# Patient Record
Sex: Male | Born: 1970 | Race: Black or African American | Hispanic: No | Marital: Single | Smoking: Light tobacco smoker
Health system: Southern US, Community
[De-identification: ages and names within clinical notes are randomized; demographics above are authoritative.]

## PROBLEM LIST (undated history)

## (undated) DIAGNOSIS — F329 Major depressive disorder, single episode, unspecified: Secondary | ICD-10-CM

## (undated) DIAGNOSIS — F32A Depression, unspecified: Secondary | ICD-10-CM

## (undated) DIAGNOSIS — M1990 Unspecified osteoarthritis, unspecified site: Secondary | ICD-10-CM

## (undated) DIAGNOSIS — E785 Hyperlipidemia, unspecified: Secondary | ICD-10-CM

## (undated) DIAGNOSIS — C801 Malignant (primary) neoplasm, unspecified: Secondary | ICD-10-CM

## (undated) DIAGNOSIS — N2889 Other specified disorders of kidney and ureter: Secondary | ICD-10-CM

## (undated) DIAGNOSIS — K219 Gastro-esophageal reflux disease without esophagitis: Secondary | ICD-10-CM

## (undated) DIAGNOSIS — I10 Essential (primary) hypertension: Secondary | ICD-10-CM

## (undated) HISTORY — DX: Unspecified osteoarthritis, unspecified site: M19.90

## (undated) HISTORY — DX: Depression, unspecified: F32.A

## (undated) HISTORY — DX: Hyperlipidemia, unspecified: E78.5

## (undated) HISTORY — DX: Other specified disorders of kidney and ureter: N28.89

## (undated) HISTORY — DX: Essential (primary) hypertension: I10

## (undated) HISTORY — PX: KNEE ARTHROSCOPY: SUR90

## (undated) HISTORY — DX: Gastro-esophageal reflux disease without esophagitis: K21.9

## (undated) HISTORY — DX: Malignant (primary) neoplasm, unspecified: C80.1

## (undated) HISTORY — DX: Major depressive disorder, single episode, unspecified: F32.9

---

## 2012-07-29 ENCOUNTER — Ambulatory Visit: Payer: Self-pay

## 2012-09-03 DIAGNOSIS — L663 Perifolliculitis capitis abscedens: Secondary | ICD-10-CM | POA: Insufficient documentation

## 2014-09-28 DIAGNOSIS — M170 Bilateral primary osteoarthritis of knee: Secondary | ICD-10-CM | POA: Insufficient documentation

## 2014-10-13 DIAGNOSIS — I10 Essential (primary) hypertension: Secondary | ICD-10-CM | POA: Insufficient documentation

## 2014-10-13 DIAGNOSIS — R0609 Other forms of dyspnea: Secondary | ICD-10-CM | POA: Insufficient documentation

## 2016-02-12 DIAGNOSIS — G8929 Other chronic pain: Secondary | ICD-10-CM | POA: Insufficient documentation

## 2016-02-12 DIAGNOSIS — M25562 Pain in left knee: Secondary | ICD-10-CM

## 2016-02-12 DIAGNOSIS — M25561 Pain in right knee: Secondary | ICD-10-CM

## 2016-12-10 ENCOUNTER — Encounter: Payer: Self-pay | Admitting: Student in an Organized Health Care Education/Training Program

## 2016-12-10 ENCOUNTER — Ambulatory Visit
Payer: Medicaid Other | Attending: Student in an Organized Health Care Education/Training Program | Admitting: Student in an Organized Health Care Education/Training Program

## 2016-12-10 VITALS — BP 128/89 | HR 60 | Temp 98.1°F | Resp 16 | Ht 72.0 in | Wt 348.0 lb

## 2016-12-10 DIAGNOSIS — M25561 Pain in right knee: Secondary | ICD-10-CM | POA: Diagnosis not present

## 2016-12-10 DIAGNOSIS — Z9889 Other specified postprocedural states: Secondary | ICD-10-CM | POA: Diagnosis not present

## 2016-12-10 DIAGNOSIS — M170 Bilateral primary osteoarthritis of knee: Secondary | ICD-10-CM | POA: Insufficient documentation

## 2016-12-10 DIAGNOSIS — G8929 Other chronic pain: Secondary | ICD-10-CM

## 2016-12-10 DIAGNOSIS — M25562 Pain in left knee: Secondary | ICD-10-CM | POA: Insufficient documentation

## 2016-12-10 DIAGNOSIS — Z87442 Personal history of urinary calculi: Secondary | ICD-10-CM | POA: Insufficient documentation

## 2016-12-10 DIAGNOSIS — G894 Chronic pain syndrome: Secondary | ICD-10-CM

## 2016-12-10 DIAGNOSIS — M1711 Unilateral primary osteoarthritis, right knee: Secondary | ICD-10-CM

## 2016-12-10 DIAGNOSIS — I129 Hypertensive chronic kidney disease with stage 1 through stage 4 chronic kidney disease, or unspecified chronic kidney disease: Secondary | ICD-10-CM | POA: Diagnosis not present

## 2016-12-10 DIAGNOSIS — Z79899 Other long term (current) drug therapy: Secondary | ICD-10-CM | POA: Insufficient documentation

## 2016-12-10 DIAGNOSIS — M1712 Unilateral primary osteoarthritis, left knee: Secondary | ICD-10-CM

## 2016-12-10 DIAGNOSIS — N189 Chronic kidney disease, unspecified: Secondary | ICD-10-CM | POA: Insufficient documentation

## 2016-12-10 DIAGNOSIS — I10 Essential (primary) hypertension: Secondary | ICD-10-CM | POA: Diagnosis not present

## 2016-12-10 MED ORDER — GABAPENTIN 300 MG PO CAPS: ORAL_CAPSULE | ORAL | 1 refills | Status: DC

## 2016-12-10 NOTE — Progress Notes (Signed)
Safety precautions to be maintained throughout the outpatient stay will include: orient to surroundings, keep bed in low position, maintain call bell within reach at all times, provide assistance with transfer out of bed and ambulation.  

## 2016-12-10 NOTE — Progress Notes (Signed)
Patient's Name: Jeremy Conley  MRN: [id number]  Referring Provider: Leanor Kail, MD  DOB: Dec 10, 1970  PCP: [facility]  DOS: 12/10/2016  Note by: Gillis Santa, MD  Service setting: Ambulatory outpatient  Specialty: Interventional Pain Management  Location: [facility] (AMB) Pain Management Facility  Visit type: Initial Patient Evaluation  Patient type: New Patient   Primary Reason(s) for Visit: Encounter for initial evaluation of one or more chronic problems (new to examiner) potentially causing chronic pain, and posing a threat to normal musculoskeletal function. (Level of risk: High) CC: Knee Pain (bilateral scheduled for knee surgery to be followed by 2nd knee)  HPI  Jeremy Conley is a 46 y.o. year old, male patient, who comes today to see [place] for the first time for an initial evaluation of his chronic pain. He has Chronic pain syndrome; Morbid obesity ([place]); Bilateral primary osteoarthritis of knee; Essential hypertension; Dyspnea on effort; and H/O arthroscopic knee surgery on his problem list. Today he comes in for evaluation of his Knee Pain (bilateral scheduled for knee surgery to be followed by 2nd knee)  Pleasant male, history of morbid obesity status post 2 knee arthroscopies on the left- 1 and 1993 followed by an anterior cruciate ligament repair in 1997. Patient is a candidate for bilateral knee replacements which will tentatively be done in October or November of this year. Patient was referred by his primary care physician to optimize pain control prior to his knee replacement surgery. Plan is to start with left knee replacement in October tentatively followed by right knee replacement thereafter.  Pain Assessment: Location: Left, Right Knee Radiating: shoots down to the foot on the right, this occurred after having a genicular nerve block in May Onset: More than a month ago Duration: Chronic pain Quality: Throbbing, Constant Severity: 6 /10 (self-reported pain score)  Note:  Reported level is compatible with observation.                   Effect on ADL: slows him down.  altered his gait.  constantly feels as if he is going to fall Timing: Constant Modifying factors: medication, hot soaks.    Onset and Duration: Sudden and Gradual Cause of pain: Work related accident or event Severity: Getting worse, NAS-11 at its worse: 8/10, NAS-11 at its best: 6/10, NAS-11 now: 7/10 and NAS-11 on the average: 7/10 Timing: Morning, Afternoon and Evening Aggravating Factors: Bending, Climbing, Kneeling, Lifiting, Squatting, Stooping , Twisting, Walking, Walking uphill and Walking downhill Alleviating Factors: Medications and Resting Associated Problems: Numbness, Sweating, Tingling, Weakness, Pain that wakes patient up and Pain that does not allow patient to sleep Quality of Pain: Agonizing, Exhausting, Nagging, Sharp, Shooting, Stabbing, Tender, Throbbing, Tingling, Toothache-like and Uncomfortable Previous Examinations or Tests: Nerve block, X-rays and Orthoperdic evaluation Previous Treatments: Narcotic medications and The patient denies patient has had steroid injections and genicular nerve  block and denies benefit patient had a genicular nerve block done on the right on May 1 which she states was not helpful. Patient also has had intra-articular knee injections bilaterally in the past which were not helpful.  The patient comes into the clinics today for the first time for a chronic pain management evaluation.   Today I took the time to provide the patient with information regarding my pain practice. The patient was informed that my practice is divided into two sections: an interventional pain management section, as well as a completely separate and distinct medication management section. I explained that I have  procedure days for my interventional therapies, and evaluation days for follow-ups and medication management. Because of the amount of documentation required during both,  they are kept separated. This means that there is the possibility that he may be scheduled for a procedure on one day, and medication management the next. I have also informed him that because of staffing and facility limitations, I no longer take patients for medication management only. To illustrate the reasons for this, I gave the patient the example of surgeons, and how inappropriate it would be to refer a patient to his/her care, just to write for the post-surgical antibiotics on a surgery done by a different surgeon.   Because interventional pain management is my board-certified specialty, the patient was informed that joining my practice means that they are open to any and all interventional therapies. I made it clear that this does not mean that they will be forced to have any procedures done. What this means is that I believe interventional therapies to be essential part of the diagnosis and proper management of chronic pain conditions. Therefore, patients not interested in these interventional alternatives will be better served under the care of a different practitioner.  The patient was also made aware of my Comprehensive Pain Management Safety Guidelines where by joining my practice, they limit all of their nerve blocks and joint injections to those done by our practice, for as long as we are retained to manage their care.   Historic Controlled Substance Pharmacotherapy Review  PMP and historical list of controlled substances: Hydrocodone 5 mg 3 times a day when necessary, last prescription provided by [facility] Abbot, 11/21/2016 Most recent opioid analgesic: Hydrocodone 5 mg 3 times a day when necessary, last prescription provided by Hampton Abbot. Current opioid analgesics: None Highest recorded MME/day: 30 mg/day Medications: Patient brought medications to be checked, as requested Pharmacodynamics: Desired effects: Analgesia: The patient reports >50% benefit. Reported improvement in  function: The patient reports medication allows him to accomplish basic ADLs. Clinically meaningful improvement in function (CMIF): Sustained CMIF goals met Perceived effectiveness: Described as relatively effective, allowing for increase in activities of daily living (ADL) Undesirable effects: Side-effects or Adverse reactions: None reported Historical Monitoring: The patient  reports that he does not use drugs. List of all UDS Test(s): No results found for: MDMA, COCAINSCRNUR, PCPSCRNUR, PCPQUANT, CANNABQUANT, THCU, [place] List of all Serum Drug Screening Test(s):  No results found for: AMPHSCRSER, BARBSCRSER, BENZOSCRSER, COCAINSCRSER, PCPSCRSER, PCPQUANT, THCSCRSER, CANNABQUANT, OPIATESCRSER, OXYSCRSER, PROPOXSCRSER Historical Background Evaluation: [place] PDMP: Six (6) year initial data search conducted.             [place] Department of public safety, offender search: Editor, commissioning Information) Non-contributory Risk Assessment Profile: Aberrant behavior: None observed or detected today Risk factors for fatal opioid overdose: age 47-57 years old, male gender and sleep apnea Fatal overdose hazard ratio (HR): Calculation deferred Non-fatal overdose hazard ratio (HR): Calculation deferred Risk of opioid abuse or dependence: 0.7-3.0% with doses ? 36 MME/day and 6.1-26% with doses ? 120 MME/day. Substance use disorder (SUD) risk level: Pending results of Medical Psychology Evaluation for SUD Opioid risk tool (ORT) (Total Score): 0  ORT Scoring interpretation table:  Score <3 = Low Risk for SUD  Score between 4-7 = Moderate Risk for SUD  Score >8 = High Risk for Opioid Abuse   PHQ-2 Depression Scale:  Total score: 0  PHQ-2 Scoring interpretation table: (Score and probability of major depressive disorder)  Score 0 = No depression  Score 1 =  15.4% Probability  Score 2 = 21.1% Probability  Score 3 = 38.4% Probability  Score 4 = 45.5% Probability  Score 5 = 56.4% Probability  Score 6 = 78.6%  Probability   PHQ-9 Depression Scale:  Total score: 0  PHQ-9 Scoring interpretation table:  Score 0-4 = No depression  Score 5-9 = Mild depression  Score 10-14 = Moderate depression  Score 15-19 = Moderately severe depression  Score 20-27 = Severe depression (2.4 times higher risk of SUD and 2.89 times higher risk of overuse)   Pharmacologic Plan: Pending ordered tests and/or consults            Initial impression: Pending review of available data and ordered tests.  Meds   Current Meds  Medication Sig  . hydrochlorothiazide (HYDRODIURIL) 12.5 MG tablet Take 12.5 mg by mouth daily.  Marland Kitchen HYDROcodone-acetaminophen (NORCO/VICODIN) 5-325 MG tablet Take 1 tablet by mouth every 4 (four) hours as needed.  Marland Kitchen losartan (COZAAR) 25 MG tablet Take 1 tablet by mouth daily.  . meloxicam (MOBIC) 15 MG tablet Take 15 mg by mouth daily.    Imaging Review   Results for orders placed in visit on 07/29/12  DG Knee Complete 4 Views Right   Narrative PRIOR REPORT IMPORTED FROM AN EXTERNAL SYSTEM   PRIOR REPORT IMPORTED FROM THE [facility] WORKFLOW SYSTEM   REASON FOR EXAM:    chronic knee pain knots or cysts on head  COMMENTS:   PROCEDURE:     DXR - DXR KNEE RT COMP WITH OBLIQUES  - Jul 29 2012  1:53PM   RESULT:     Right knee images show multicompartmental degenerative  narrowing  with subchondral sclerosis and marginal hypertrophic spurring. There is no  definite fracture or dislocation. No foreign body is evident. There is  irregularity in the contour of the femoral heads.   IMPRESSION:  1. Severe degenerative change. No acute bony abnormality evident.   Dictation Site: 2      I definitely slow as a prescription prescribed what is presented Complexity Note: Imaging results reviewed. Results discussed using Layman's terms.               ROS  Cardiovascular History: High blood pressure Pulmonary or Respiratory History: No reported pulmonary signs or symptoms such as wheezing and difficulty  taking a deep full breath (Asthma), difficulty blowing air out (Emphysema), coughing up mucus (Bronchitis), persistent dry cough, or temporary stoppage of breathing during sleep Neurological History: No reported neurological signs or symptoms such as seizures, abnormal skin sensations, urinary and/or fecal incontinence, being born with an abnormal open spine and/or a tethered spinal cord Review of Past Neurological Studies: No results found for this or any previous visit. Psychological-Psychiatric History: No reported psychological or psychiatric signs or symptoms such as difficulty sleeping, anxiety, depression, delusions or hallucinations (schizophrenial), mood swings (bipolar disorders) or suicidal ideations or attempts Gastrointestinal History: No reported gastrointestinal signs or symptoms such as vomiting or evacuating blood, reflux, heartburn, alternating episodes of diarrhea and constipation, inflamed or scarred liver, or pancreas or irrregular and/or infrequent bowel movements Genitourinary History: No reported renal or genitourinary signs or symptoms such as difficulty voiding or producing urine, peeing blood, non-functioning kidney, kidney stones, difficulty emptying the bladder, difficulty controlling the flow of urine, or chronic kidney disease Hematological History: No reported hematological signs or symptoms such as prolonged bleeding, low or poor functioning platelets, bruising or bleeding easily, hereditary bleeding problems, low energy levels due to low hemoglobin or being anemic Endocrine  History: No reported endocrine signs or symptoms such as high or low blood sugar, rapid heart rate due to high thyroid levels, obesity or weight gain due to slow thyroid or thyroid disease Rheumatologic History: Joint aches and or swelling due to excess weight (Osteoarthritis) Musculoskeletal History: Negative for myasthenia gravis, muscular dystrophy, multiple sclerosis or malignant hyperthermia Work  History: Out of work due to pain  Allergies  Mr. Breault has No Known Allergies.  Laboratory Chemistry  Inflammation Markers (CRP: Acute Phase) (ESR: Chronic Phase) No results found for: CRP, ESRSEDRATE               Renal Function Markers No results found for: BUN, CREATININE, GFRAA, GFRNONAA               Hepatic Function Markers No results found for: AST, ALT, ALBUMIN, ALKPHOS, HCVAB               Electrolytes No results found for: NA, K, CL, CALCIUM, MG               Neuropathy Markers No results found for: [id number]               Bone Pathology Markers No results found for: Hendricks Milo, VD125OH2TOT, [id number], [id number], 25OHVITD1, 25OHVITD2, 25OHVITD3, CALCIUM, TESTOFREE, TESTOSTERONE               Coagulation Parameters No results found for: INR, LABPROT, APTT, PLT               Cardiovascular Markers No results found for: BNP, HGB, HCT               Note: Lab results reviewed.  PFSH  Drug: Mr. Sampedro  reports that he does not use drugs. Alcohol:  reports that he drinks alcohol. Tobacco:  reports that he has never smoked. He has never used smokeless tobacco. Medical:  has a past medical history of Arthritis and Hypertension. Family: family history includes Heart disease in his father.  Past Surgical History:  Procedure Laterality Date  . KNEE ARTHROSCOPY Left 1993 and 1997   Active Ambulatory Problems    Diagnosis Date Noted  . Chronic pain syndrome 12/10/2016  . Morbid obesity ([place]) 12/10/2016  . Bilateral primary osteoarthritis of knee 09/28/2014  . Essential hypertension 10/13/2014  . Dyspnea on effort 10/13/2014  . H/O arthroscopic knee surgery 12/10/2016   Resolved Ambulatory Problems    Diagnosis Date Noted  . No Resolved Ambulatory Problems   Past Medical History:  Diagnosis Date  . Arthritis   . Hypertension    Constitutional Exam  General appearance: alert, oriented, in no distress and morbidly obese Vitals:   12/10/16 1122  BP:  128/89  Pulse: 60  Resp: 16  Temp: 98.1 F (36.7 C)  TempSrc: Oral  SpO2: 99%  Weight: (!) 348 lb (157.9 kg)  Height: 6' (1.829 m)   BMI Assessment: Estimated body mass index is 47.2 kg/m as calculated from the following:   Height as of this encounter: 6' (1.829 m).   Weight as of this encounter: 348 lb (157.9 kg).  BMI interpretation table: BMI level Category Range association with higher incidence of chronic pain  <18 kg/m2 Underweight   18.5-24.9 kg/m2 Ideal body weight   25-29.9 kg/m2 Overweight Increased incidence by 20%  30-34.9 kg/m2 Obese (Class I) Increased incidence by 68%  35-39.9 kg/m2 Severe obesity (Class II) Increased incidence by 136%  >40 kg/m2 Extreme obesity (Class III) Increased incidence by 254%  BMI Readings from Last 4 Encounters:  12/10/16 47.20 kg/m   Wt Readings from Last 4 Encounters:  12/10/16 (!) 348 lb (157.9 kg)  Psych/Mental status: Alert, oriented x 3 (person, place, & time)       Eyes: PERLA Respiratory: No evidence of acute respiratory distress  Cervical Spine Exam  Inspection: No masses, redness, or swelling Alignment: Symmetrical Functional ROM: Unrestricted ROM      Stability: No instability detected Muscle strength & Tone: Functionally intact Sensory: Unimpaired Palpation: No palpable anomalies              Upper Extremity (UE) Exam    Side: Right upper extremity  Side: Left upper extremity  Inspection: No masses, redness, swelling, or asymmetry. No contractures  Inspection: No masses, redness, swelling, or asymmetry. No contractures  Functional ROM: Unrestricted ROM          Functional ROM: Unrestricted ROM          Muscle strength & Tone: Functionally intact  Muscle strength & Tone: Functionally intact  Sensory: Unimpaired  Sensory: Unimpaired  Palpation: No palpable anomalies              Palpation: No palpable anomalies              Specialized Test(s): Deferred         Specialized Test(s): Deferred          Thoracic  Spine Exam  Inspection: No masses, redness, or swelling Alignment: Symmetrical Functional ROM: Decreased ROM Stability: No instability detected Sensory: Unimpaired Muscle strength & Tone: No palpable anomalies  Lumbar Spine Exam  Inspection: No masses, redness, or swelling Alignment: Symmetrical Functional ROM: Decreased ROM      Stability: Possibly unstable Muscle strength & Tone: Functionally intact Sensory: Unimpaired Palpation: No palpable anomalies       Provocative Tests: Lumbar Hyperextension and rotation test: Positive       Lumbar Lateral bending test: Positive       Patrick's Maneuver: evaluation deferred today                    Gait & Posture Assessment  Ambulation: Patient ambulates using a cane Gait: Limited. Using assistive device to ambulate Posture: WNL   Lower Extremity Exam    Side: Right lower extremity  Side: Left lower extremity  Inspection: No masses, redness, swelling, or asymmetry. No contractures  Inspection: No masses, redness, swelling, or asymmetry. No contractures  Functional ROM: Decreased ROM          Functional ROM: Decreased ROM          Muscle strength & Tone: Normal strength (5/5)  Muscle strength & Tone: Movement possible against some resistance (4/5)  Sensory: Movement-associated pain  Sensory: Movement-associated discomfort  Palpation: Complains of area being tender to palpation- knee area  Palpation: Uncomfortable   Assessment  Primary Diagnosis & Pertinent Problem List: The primary encounter diagnosis was Chronic pain syndrome. Diagnoses of Primary osteoarthritis of right knee, Primary osteoarthritis of left knee, Morbid obesity ([place]), Essential hypertension, Chronic pain of both knees, and H/O arthroscopic knee surgery were also pertinent to this visit.  Visit Diagnosis (New problems to examiner): 1. Chronic pain syndrome   2. Primary osteoarthritis of right knee   3. Primary osteoarthritis of left knee   4. Morbid obesity ([place])    5. Essential hypertension   6. Chronic pain of both knees   7. H/O arthroscopic knee surgery    Plan of Care (Initial  workup plan)  Note: Please be advised that as per protocol, today's visit has been an evaluation only. We have not taken over the patient's controlled substance management. Patient DOES NOT have opioid contract with [facility] yet.  Problem-specific plan:  Mr. Cothern is a very pleasant 46 year old gentleman with past medical history of morbid obesity, hypertension, primary osteoarthritis of bilateral knees. Patient has had left knee arthroscopies done in 1993 followed by an anterior cruciate ligament repair in 1997. He is deemed to be a candidate for bilateral knee replacement surgery with goals of starting with the left knee replacement first in October followed by right knee replacement thereafter. In terms of current medications the patient takes Mobic 15 mg daily and in the past was prescribed hydrocodone 5 mg as needed which she was taking up to 3 times a day. This was prescribed by his primary care provider for a short period of time.  We discussed starting gabapentin which should help out with his generalized knee pain. I told the patient to start at a dose of 300 mg daily at bedtime for 1 week, increase to 300 mg twice a day thereafter, if no side effects to increase to 300 mg 3 times a day on week 3. I also emphasized to the patient that minimizing his opioid consumption prior to his surgery will allow better pain control with opioid therapy for his postoperative pain. However I do agree that the patient's pain preoperatively should be better controlled. I will try to add and titrate non-opioid analgesics for this patient. If this is not effective we can consider very low dose opioid therapy such as hydrocodone 5 mg twice a day to 3 times a day when necessary. Patient has had a right genicular nerve block done on May 1 which she did not find effective. Patient has also had  intra-articular steroid injections that were performed in the past which were not helpful and in fact caused him to have elevated blood pressures.   Today we did the following:  #1. Urine toxicology screen. Expect this to be negative. #2. Start gabapentin as below. Goal is to get to 300 mg 3 times a day. Titration of structures given. Continue with Mobic 69m daily as prescribed. #3. Med/psych evaluation for chronic opioid management risk assessment. #4. Patient counseled on healthy eating, exercise, and weight loss #5. Patient will follow-up in 4-6 weeks and at that time we can discuss starting low-dose opioid therapy if gabapentin is not helpful up and assuming patient has had med psych evaluation done with no issues and urine toxicology screen was negative.   Ordered Lab-work, Procedure(s), Referral(s), & Consult(s): Orders Placed This Encounter  Procedures  . Compliance Drug Analysis, Ur  . Ambulatory referral to Psychology   Pharmacotherapy (current): Medications ordered:  Meds ordered this encounter  Medications  . gabapentin (NEURONTIN) 300 MG capsule    Sig: 300 mg qhs x 1 week, then 300 mg BID x 1 week, then 300 mg TID    Dispense:  90 capsule    Refill:  1   Medications administered during this visit: Mr. SDouthithad no medications administered during this visit.   Pharmacological management options:  Opioid Analgesics: The patient was informed that there is no guarantee that he would be a candidate for opioid analgesics. The decision will be made following CDC guidelines. This decision will be based on the results of diagnostic studies, as well as Mr. Blankenbeckler's risk profile.   Membrane stabilizer: Gabapentin (has not  tried Lyrica or Topamax)  Muscle relaxant: To be determined at a later time  NSAID: continue Mobic as currently prescribed  Other analgesic(s): consider neuropathics, gels, TCAs, SNRs   Interventional management options: Mr. Messimer was informed that there is  no guarantee that he would be a candidate for interventional therapies. The decision will be based on the results of diagnostic studies, as well as Mr. Tuel's risk profile.  Procedure(s) under consideration:  -Repeat genicular nerve block -lumbar facet steroid injections   Provider-requested follow-up: Return in about 4 weeks (around 01/07/2017).  No future appointments.  Primary Care Physician: [facility] Location: [facility] Note by: Gillis Santa, M.D, Date: 12/10/2016; Time: 12:34 PM  Patient Instructions   ____________________________________________________________________________________________  Appointment Policy Summary  It is our goal and responsibility to provide the medical community with assistance in the evaluation and management of patients with chronic pain. Unfortunately our resources are limited. Because we do not have an unlimited amount of time, or available appointments, we are required to closely monitor and manage their use. The following rules exist to maximize their use:  Patient's responsibilities: 1. Punctuality:  At what time should I arrive? You should be physically present in our office 30 minutes before your scheduled appointment. Your scheduled appointment is with your assigned healthcare provider. However, it takes 5-10 minutes to be "checked-in", and another 15 minutes for the nurses to do the admission. If you arrive to our office at the time you were given for your appointment, you will end up being at least 20-25 minutes late to your appointment with the provider. 2. Tardiness:  What happens if I arrive only a few minutes after my scheduled appointment time? You will need to reschedule your appointment. The cutoff is your appointment time. This is why it is so important that you arrive at least 30 minutes before that appointment. If you have an appointment scheduled for 10:00 AM and you arrive at 10:01, you will be  required to reschedule your appointment.  3. Plan ahead:  Always assume that you will encounter traffic on your way in. Plan for it. If you are dependent on a driver, make sure they understand these rules and the need to arrive early. 4. Other appointments and responsibilities:  Avoid scheduling any other appointments before or after your pain clinic appointments.  5. Be prepared:  Write down everything that you need to discuss with your healthcare provider and give this information to the admitting nurse. Write down the medications that you will need refilled. Bring your pills and bottles (even the empty ones), to all of your appointments, except for those where a procedure is scheduled. 6. No children or pets:  Find someone to take care of them. It is not appropriate to bring them in. 7. Scheduling changes:  We request "advanced notification" of any changes or cancellations. 8. Advanced notification:  Defined as a time period of more than 24 hours prior to the originally scheduled appointment. This allows for the appointment to be offered to other patients. 9. Rescheduling:  When a visit is rescheduled, it will require the cancellation of the original appointment. For this reason they both fall within the category of "Cancellations".  10. Cancellations:  They require advanced notification. Any cancellation less than 24 hours before the  appointment will be recorded as a "No Show". 11. No Show:  Defined as an unkept appointment where the patient failed to notify or declare to the practice their intention or  inability to keep the appointment.  Corrective process for repeat offenders:  1. Tardiness: Three (3) episodes of rescheduling due to late arrivals will be recorded as one (1) "No Show". 2. Cancellation or reschedule: Three (3) cancellations or rescheduling will be recorded as one (1) "No Show". 3. "No Shows": Three (3) "No Shows" within a 12 month period will result in discharge from the  practice.  ____________________________________________________________________________________________ ____________________________________________________________________________________________  Pain Management Weight Control Diet   Note: Before starting this diet, make sure to talk to your primary care physician to make sure it is safe for you.   Breakfast:   1 boiled or pouched egg. You may use the egg white ready-made preparations.  1 wheat low calorie toast.   8 oz. black coffee with stevia (maximum of 2 packs). (No milk or creamer, and no other type of sweetener but stevia).   Lunch & Dinner:   5 oz. Lean Protein like chicken, fish, or lean meat (above 95% fat free).   No pork or any type of cold cuts or processed meats.   Steamed, backed or grilled, but not fried.   No oils, fats, or butter.   1 cup of steamed vegetables, or 1.5 cups if raw.   1 serving of salad, but no dressings, except vinegar or lemon juice.   Mid-day & Mid-afternoon snack:   1 fruit. No bananas. (total of 2 fruits per day)   Important Rules:  4. Must drink 100 oz. or more of water per day.   Consult your Primary Care Physician if you have a history of kidney failure, or congestive heart failure before doing this.  5. Take calcium and magnesium every day to avoid night cramps.   Consult your Primary Care Physician if you have a history of kidney failure, hypercalcemia, or parathyroid problems before doing this.   Over-the-counter calcium 600 to 1200 mg per day (in the morning). Take with Vitamin D 2000 IU every day.   Over-the-counter magnesium 400 to 500 mg per day (1-2 hours prior to bedtime).  6. Control salt intake. (You can use it but in moderation.)  7. Do not eat anything after 6:00pm.  8. Weight yourself every morning at the same time and record weight on a notebook.  9. Mix "Benefiber" 3 to 5 table spoons in water and drink before meals.  10. No sodas.  11. No alcohol.  12. No  sugar.  13. No artificial sweeteners.   Stevia without sugar is the only sweetener aloud.  14. No bread except for the one breakfast toast.   Low calorie wheat bread.  15. Duration of diet: 2 weeks at a time with 2 days' rest, then repeat.  16. Do not over-eat or over-indulge yourself in the 2 days of rest from the diet.  ____________________________________________________________________________________________

## 2016-12-10 NOTE — Patient Instructions (Addendum)
____________________________________________________________________________________________  Appointment Policy Summary  It is our goal and responsibility to provide the medical community with assistance in the evaluation and management of patients with chronic pain. Unfortunately our resources are limited. Because we do not have an unlimited amount of time, or available appointments, we are required to closely monitor and manage their use. The following rules exist to maximize their use:  Patient's responsibilities: 1. Punctuality:  At what time should I arrive? You should be physically present in our office 30 minutes before your scheduled appointment. Your scheduled appointment is with your assigned healthcare provider. However, it takes 5-10 minutes to be "checked-in", and another 15 minutes for the nurses to do the admission. If you arrive to our office at the time you were given for your appointment, you will end up being at least 20-25 minutes late to your appointment with the provider. 2. Tardiness:  What happens if I arrive only a few minutes after my scheduled appointment time? You will need to reschedule your appointment. The cutoff is your appointment time. This is why it is so important that you arrive at least 30 minutes before that appointment. If you have an appointment scheduled for 10:00 AM and you arrive at 10:01, you will be required to reschedule your appointment.  3. Plan ahead:  Always assume that you will encounter traffic on your way in. Plan for it. If you are dependent on a driver, make sure they understand these rules and the need to arrive early. 4. Other appointments and responsibilities:  Avoid scheduling any other appointments before or after your pain clinic appointments.  5. Be prepared:  Write down everything that you need to discuss with your healthcare provider and give this information to the admitting nurse. Write down the medications that you will need  refilled. Bring your pills and bottles (even the empty ones), to all of your appointments, except for those where a procedure is scheduled. 6. No children or pets:  Find someone to take care of them. It is not appropriate to bring them in. 7. Scheduling changes:  We request "advanced notification" of any changes or cancellations. 8. Advanced notification:  Defined as a time period of more than 24 hours prior to the originally scheduled appointment. This allows for the appointment to be offered to other patients. 9. Rescheduling:  When a visit is rescheduled, it will require the cancellation of the original appointment. For this reason they both fall within the category of "Cancellations".  10. Cancellations:  They require advanced notification. Any cancellation less than 24 hours before the  appointment will be recorded as a "No Show". 11. No Show:  Defined as an unkept appointment where the patient failed to notify or declare to the practice their intention or inability to keep the appointment.  Corrective process for repeat offenders:  1. Tardiness: Three (3) episodes of rescheduling due to late arrivals will be recorded as one (1) "No Show". 2. Cancellation or reschedule: Three (3) cancellations or rescheduling will be recorded as one (1) "No Show". 3. "No Shows": Three (3) "No Shows" within a 12 month period will result in discharge from the practice.  ____________________________________________________________________________________________ ____________________________________________________________________________________________  Pain Management Weight Control Diet   Note: Before starting this diet, make sure to talk to your primary care physician to make sure it is safe for you.   Breakfast:   1 boiled or pouched egg. You may use the egg white ready-made preparations.  1 wheat low calorie toast.   8  oz. black coffee with stevia (maximum of 2 packs). (No milk or creamer, and  no other type of sweetener but stevia).   Lunch & Dinner:   5 oz. Lean Protein like chicken, fish, or lean meat (above 95% fat free).   No pork or any type of cold cuts or processed meats.   Steamed, backed or grilled, but not fried.   No oils, fats, or butter.   1 cup of steamed vegetables, or 1.5 cups if raw.   1 serving of salad, but no dressings, except vinegar or lemon juice.   Mid-day & Mid-afternoon snack:   1 fruit. No bananas. (total of 2 fruits per day)   Important Rules:  4. Must drink 100 oz. or more of water per day.   Consult your Primary Care Physician if you have a history of kidney failure, or congestive heart failure before doing this.  5. Take calcium and magnesium every day to avoid night cramps.   Consult your Primary Care Physician if you have a history of kidney failure, hypercalcemia, or parathyroid problems before doing this.   Over-the-counter calcium 600 to 1200 mg per day (in the morning). Take with Vitamin D 2000 IU every day.   Over-the-counter magnesium 400 to 500 mg per day (1-2 hours prior to bedtime).  6. Control salt intake. (You can use it but in moderation.)  7. Do not eat anything after 6:00pm.  8. Weight yourself every morning at the same time and record weight on a notebook.  9. Mix "Benefiber" 3 to 5 table spoons in water and drink before meals.  10. No sodas.  11. No alcohol.  12. No sugar.  13. No artificial sweeteners.   Stevia without sugar is the only sweetener aloud.  14. No bread except for the one breakfast toast.   Low calorie wheat bread.  15. Duration of diet: 2 weeks at a time with 2 days' rest, then repeat.  16. Do not over-eat or over-indulge yourself in the 2 days of rest from the diet.  ____________________________________________________________________________________________

## 2016-12-16 LAB — COMPLIANCE DRUG ANALYSIS, UR

## 2017-04-08 ENCOUNTER — Other Ambulatory Visit: Payer: Self-pay

## 2017-04-08 ENCOUNTER — Encounter: Payer: Self-pay | Admitting: Emergency Medicine

## 2017-04-08 ENCOUNTER — Emergency Department
Admission: EM | Admit: 2017-04-08 | Discharge: 2017-04-08 | Disposition: A | Discharge status: Home / Self Care | Payer: Medicaid Other | Attending: Emergency Medicine | Admitting: Emergency Medicine

## 2017-04-08 DIAGNOSIS — I10 Essential (primary) hypertension: Secondary | ICD-10-CM

## 2017-04-08 DIAGNOSIS — M79602 Pain in left arm: Secondary | ICD-10-CM | POA: Insufficient documentation

## 2017-04-08 DIAGNOSIS — Z79899 Other long term (current) drug therapy: Secondary | ICD-10-CM | POA: Insufficient documentation

## 2017-04-08 DIAGNOSIS — R61 Generalized hyperhidrosis: Secondary | ICD-10-CM | POA: Diagnosis not present

## 2017-04-08 DIAGNOSIS — R0602 Shortness of breath: Secondary | ICD-10-CM

## 2017-04-08 DIAGNOSIS — R42 Dizziness and giddiness: Secondary | ICD-10-CM | POA: Diagnosis not present

## 2017-04-08 LAB — BASIC METABOLIC PANEL
ANION GAP: 10 (ref 5–15)
BUN: 17 mg/dL (ref 6–20)
CO2: 24 mmol/L (ref 22–32)
Calcium: 9.2 mg/dL (ref 8.9–10.3)
Chloride: 96 mmol/L — ABNORMAL LOW (ref 101–111)
Creatinine, Ser: 0.8 mg/dL (ref 0.61–1.24)
GFR calc non Af Amer: >60 mL/min (ref >60)
GLUCOSE: 129 mg/dL — AB (ref 65–99)
POTASSIUM: 3.5 mmol/L (ref 3.5–5.1)
Sodium: 130 mmol/L — ABNORMAL LOW (ref 135–145)

## 2017-04-08 LAB — URINALYSIS, COMPLETE (UACMP) WITH MICROSCOPIC
BACTERIA UA: NONE SEEN
BILIRUBIN URINE: NEGATIVE
Glucose, UA: NEGATIVE mg/dL
Hgb urine dipstick: NEGATIVE
KETONES UR: NEGATIVE mg/dL
Leukocytes, UA: NEGATIVE
Nitrite: NEGATIVE
PROTEIN: NEGATIVE mg/dL
Specific Gravity, Urine: 1.013 (ref 1.005–1.030)
pH: 5 (ref 5.0–8.0)

## 2017-04-08 LAB — CBC
HEMATOCRIT: 42.2 % (ref 40.0–52.0)
HEMOGLOBIN: 14.2 g/dL (ref 13.0–18.0)
MCH: 30.3 pg (ref 26.0–34.0)
MCHC: 33.6 g/dL (ref 32.0–36.0)
MCV: 90.2 fL (ref 80.0–100.0)
Platelets: 272 10*3/uL (ref 150–440)
RBC: 4.68 MIL/uL (ref 4.40–5.90)
RDW: 13.7 % (ref 11.5–14.5)
WBC: 10.3 10*3/uL (ref 3.8–10.6)

## 2017-04-08 LAB — TROPONIN I: Troponin I: <0.03 ng/mL (ref <0.03)

## 2017-04-08 MED ORDER — LOSARTAN POTASSIUM 50 MG PO TABS
50.0000 mg | ORAL_TABLET | Freq: Once | ORAL | Status: AC
  Administered 2017-04-08: 50 mg via ORAL
  Filled 2017-04-08: qty 1

## 2017-04-08 NOTE — ED Notes (Signed)
Pt to ed with c/o dizziness that has been intermittent x several weeks.  Today reports left arm pain and erratic "pulse", sob, and excessive sweating with minimal activity.

## 2017-04-08 NOTE — ED Triage Notes (Signed)
htn with recent adjustment to blood pressure medication, with increased feelings of dizziness and difficulty breathing , erratic heart racing. Today left arm pain

## 2017-04-08 NOTE — ED Provider Notes (Signed)
[facility] Emergency Department Provider Note  ____________________________________________   First MD Initiated Contact with Patient 04/08/17 1403     (approximate)  I have reviewed the triage vital signs and the nursing notes.   HISTORY  Chief Complaint Dizziness   HPI Jeremy Conley is a 46 y.o. male with a history of arthritis and hypertension was presented to the emergency department today with several months of lightheadedness, diaphoresis and shortness of breath with activity as well as intermittent left arm aching.  He says that his arm is aching right now and has been aching since he woke up this morning.  He is denying any other symptoms at this time.  Says that his blood pressure is also been about 160s over 90s over the past several weeks.  He says that he takes 50 mg of losartan as well as 25 mill grams of HCT daily and has not missed any doses.    Past Medical History:  Diagnosis Date  . Arthritis   . Hypertension     Patient Active Problem List   Diagnosis Date Noted  . Chronic pain syndrome 12/10/2016  . Morbid obesity ([place]) 12/10/2016  . H/O arthroscopic knee surgery 12/10/2016  . Essential hypertension 10/13/2014  . Dyspnea on effort 10/13/2014  . Bilateral primary osteoarthritis of knee 09/28/2014    Past Surgical History:  Procedure Laterality Date  . KNEE ARTHROSCOPY Left 1993 and 1997    Prior to Admission medications   Medication Sig Start Date End Date Taking? Authorizing Provider  gabapentin (NEURONTIN) 300 MG capsule 300 mg qhs x 1 week, then 300 mg BID x 1 week, then 300 mg TID 12/10/16   Gillis Santa, MD  hydrochlorothiazide (HYDRODIURIL) 12.5 MG tablet Take 12.5 mg by mouth daily. 08/12/16 08/12/17  [provider]  HYDROcodone-acetaminophen (NORCO/VICODIN) 5-325 MG tablet Take 1 tablet by mouth every 4 (four) hours as needed. 11/21/16   [provider]  losartan (COZAAR) 25 MG tablet Take 1 tablet  by mouth daily. 09/26/16   [provider]    Allergies Patient has no known allergies.  Family History  Problem Relation Age of Onset  . Heart disease Father     Social History Social History   Tobacco Use  . Smoking status: Never Smoker  . Smokeless tobacco: Never Used  Substance Use Topics  . Alcohol use: Yes    Comment: occassional  . Drug use: No    Review of Systems  Constitutional: No fever/chills Eyes: No visual changes. ENT: No sore throat. Cardiovascular: Denies chest pain. Respiratory: As above Gastrointestinal: No abdominal pain.  No nausea, no vomiting.  No diarrhea.  No constipation. Genitourinary: Negative for dysuria. Musculoskeletal: Negative for back pain. Skin: Negative for rash. Neurological: Negative for headaches, focal weakness or numbness.   ____________________________________________   PHYSICAL EXAM:  VITAL SIGNS: ED Triage Vitals  Enc Vitals Group     BP 04/08/17 1021 (!) 160/93     Pulse Rate 04/08/17 1021 76     Resp 04/08/17 1021 18     Temp 04/08/17 1021 98.5 F (36.9 C)     Temp Source 04/08/17 1021 Oral     SpO2 04/08/17 1021 96 %     Weight 04/08/17 1022 (!) 358 lb (162.4 kg)     Height 04/08/17 1022 6' (1.829 m)     Head Circumference --      Peak Flow --      Pain Score 04/08/17 1040 0  Pain Loc --      Pain Edu? --      Excl. in [place]? --     Constitutional: Alert and oriented. Well appearing and in no acute distress. Eyes: Conjunctivae are normal.  Head: Atraumatic. Nose: No congestion/rhinnorhea. Mouth/Throat: Mucous membranes are moist.  Neck: No stridor.   Cardiovascular: Normal rate, regular rhythm. Grossly normal heart sounds.  Good peripheral circulation with equal and bilateral radial pulses. Respiratory: Normal respiratory effort.  No retractions. Lungs CTAB. Gastrointestinal: Soft and nontender. No distention.  Musculoskeletal: No lower extremity tenderness nor edema.  No joint  effusions. Neurologic:  Normal speech and language. No gross focal neurologic deficits are appreciated. Skin:  Skin is warm, dry and intact. No rash noted. Psychiatric: Mood and affect are normal. Speech and behavior are normal.  ____________________________________________   LABS (all labs ordered are listed, but only abnormal results are displayed)  Labs Reviewed  BASIC METABOLIC PANEL - Abnormal; Notable for the following components:      Result Value   Sodium 130 (*)    Chloride 96 (*)    Glucose, Bld 129 (*)    All other components within normal limits  URINALYSIS, COMPLETE (UACMP) WITH MICROSCOPIC - Abnormal; Notable for the following components:   Color, Urine YELLOW (*)    APPearance HAZY (*)    Squamous Epithelial / LPF 0-5 (*)    All other components within normal limits  CBC  TROPONIN I  CBG MONITORING, ED   ____________________________________________  EKG  ED ECG REPORT I, Doran Stabler, the attending physician, personally viewed and interpreted this ECG.   Date: 04/08/2017  EKG Time: 1040  Rate: 67  Rhythm: normal sinus rhythm  Axis: Normal  Intervals:none  ST&T Change: No ST segment elevation or depression.  No abnormal T wave inversion.  ____________________________________________  RADIOLOGY   ____________________________________________   PROCEDURES  Procedure(s) performed:   Procedures  Critical Care performed:   ____________________________________________   INITIAL IMPRESSION / ASSESSMENT AND PLAN / ED COURSE  Pertinent labs & imaging results that were available during my care of the patient were reviewed by me and considered in my medical decision making (see chart for details).  DDX: Anginal equivalent, symptomatic hypertension, diaphoresis, shortness of breath  As part of my medical decision making, I reviewed the following data within the [place] chart  reviewed   ----------------------------------------- 3:10 PM on 04/08/2017 -----------------------------------------  Patient with symptoms ongoing for months with a normal EKG.  As long as the troponin is negative I believe the patient will be safe for outpatient follow-up.  I discussed the case with Dr. Humphrey Rolls of cardiology who says that the patient should follow-up in his office at 10 AM tomorrow morning and should double his losartan dose to 100 mg and keep the HCTZ dose consistent.   ----------------------------------------- 3:49 PM on 04/08/2017 -----------------------------------------  Patient at this time without any new complaints.  Very reassuring lab work.  Patient will be discharged home at this time.  Says that his father also had a heart attack and died in his late 39s.  Patient does have risk factors but with months of symptoms and a very reassuring cardiac workup today.  He states that he will be able to follow-up with Dr.Khan tomorrow.  Also, he is understanding of the plan to increase his losartan to 100 mg daily.  He knows to return to the emergency department for any worsening or concerning symptoms.  ____________________________________________   FINAL  CLINICAL IMPRESSION(S) / ED DIAGNOSES  Hypertension.  Shortness of breath.    NEW MEDICATIONS STARTED DURING THIS VISIT:  This SmartLink is deprecated. Use AVSMEDLIST instead to display the medication list for a patient.   Note:  This document was prepared using Dragon voice recognition software and may include unintentional dictation errors.     Orbie Pyo, MD 04/08/17 1550

## 2017-04-11 ENCOUNTER — Other Ambulatory Visit: Payer: Self-pay | Admitting: Registered Nurse

## 2017-07-15 ENCOUNTER — Other Ambulatory Visit: Payer: Self-pay | Admitting: Nurse Practitioner

## 2017-07-15 DIAGNOSIS — R413 Other amnesia: Secondary | ICD-10-CM

## 2017-07-17 ENCOUNTER — Other Ambulatory Visit (HOSPITAL_COMMUNITY): Payer: Self-pay | Admitting: Nurse Practitioner

## 2017-07-17 DIAGNOSIS — R413 Other amnesia: Secondary | ICD-10-CM

## 2017-07-21 ENCOUNTER — Ambulatory Visit (HOSPITAL_COMMUNITY): Payer: Medicaid Other

## 2017-07-30 ENCOUNTER — Ambulatory Visit (HOSPITAL_COMMUNITY): Payer: Medicaid Other

## 2017-08-05 DIAGNOSIS — N2889 Other specified disorders of kidney and ureter: Secondary | ICD-10-CM | POA: Insufficient documentation

## 2017-08-07 ENCOUNTER — Ambulatory Visit (HOSPITAL_COMMUNITY)
Admission: RE | Admit: 2017-08-07 | Discharge: 2017-08-07 | Disposition: A | Discharge status: Home / Self Care | Payer: Medicaid Other | Source: Ambulatory Visit | Attending: Nurse Practitioner | Admitting: Nurse Practitioner

## 2017-08-07 DIAGNOSIS — R413 Other amnesia: Secondary | ICD-10-CM | POA: Diagnosis not present

## 2017-08-07 DIAGNOSIS — L989 Disorder of the skin and subcutaneous tissue, unspecified: Secondary | ICD-10-CM | POA: Insufficient documentation

## 2017-08-07 LAB — POCT I-STAT CREATININE: CREATININE: 1 mg/dL (ref 0.61–1.24)

## 2017-08-07 MED ORDER — GADOBENATE DIMEGLUMINE 529 MG/ML IV SOLN
20.0000 mL | Freq: Once | INTRAVENOUS | Status: AC | PRN
  Administered 2017-08-07: 20 mL via INTRAVENOUS

## 2017-08-11 ENCOUNTER — Ambulatory Visit (INDEPENDENT_AMBULATORY_CARE_PROVIDER_SITE_OTHER): Payer: Medicaid Other | Admitting: Urology

## 2017-08-11 ENCOUNTER — Encounter: Payer: Self-pay | Admitting: Urology

## 2017-08-11 VITALS — BP 132/85 | HR 76 | Ht 72.0 in | Wt 363.9 lb

## 2017-08-11 DIAGNOSIS — N289 Disorder of kidney and ureter, unspecified: Secondary | ICD-10-CM

## 2017-08-11 DIAGNOSIS — N281 Cyst of kidney, acquired: Secondary | ICD-10-CM

## 2017-08-11 NOTE — Progress Notes (Signed)
08/11/2017 9:53 AM   Jeremy Conley 1970-11-03 [id number]  Referring provider: [facility] [place], [place] [address]  Chief Complaint  Patient presents with  . New Patient (Initial Visit)    HPI: The patient was referred for a possible left renal mass.  He may have a stomach issue or hernia.  He has nighttime frequency.  His flow was good.  He does never had kidney stones or previous GU surgery.  He does have acid reflux disease  The patient had a CT scan across the street but it was in the [facility] system.  He has 2 nonenhancing cysts the largest being 1.6 cm in the left kidney.  In the inferior pole of the left kidney there was a less than 1 cm lesion that enhanced.  They were concerned that it may be a renal cell carcinoma.  Modifying factors: There are no other modifying factors  Associated signs and symptoms: There are no other associated signs and symptoms Aggravating and relieving factors: There are no other aggravating or relieving factors Severity: Moderate Duration: Persistent   PMH: Past Medical History:  Diagnosis Date  . Arthritis   . Arthritis   . Depression   . GERD (gastroesophageal reflux disease)   . Hyperlipidemia   . Hypertension     Surgical History: Past Surgical History:  Procedure Laterality Date  . KNEE ARTHROSCOPY Left 1993 and 1997    Home Medications:  Allergies as of 08/11/2017   No Known Allergies     Medication List        Accurate as of 08/11/17 11:59 PM. Always use your most recent med list.          aspirin EC 81 MG tablet Take 81 mg by mouth daily.   atorvastatin 20 MG tablet Commonly known as:  LIPITOR Take 20 mg by mouth.   clindamycin 1 % external solution Commonly known as:  CLEOCIN T Apply topically to scalp and face daily.   hydrochlorothiazide 25 MG tablet Commonly known as:  HYDRODIURIL Take 25 mg by mouth.   losartan 50 MG tablet Commonly known as:  COZAAR Take  100 mg by mouth once.   meloxicam 7.5 MG tablet Commonly known as:  MOBIC Take 7.5 mg by mouth.   triamcinolone cream 0.1 % Commonly known as:  KENALOG Apply to rash on arm 1-2 times daily as needed.       Allergies: No Known Allergies  Family History: Family History  Problem Relation Age of Onset  . Heart disease Father   . Bladder Cancer Neg Hx   . Kidney cancer Neg Hx   . Prostate cancer Neg Hx     Social History:  reports that he has never smoked. He has never used smokeless tobacco. He reports that he drinks alcohol. He reports that he does not use drugs.  ROS: UROLOGY Frequent Urination?: Yes Hard to postpone urination?: No Burning/pain with urination?: No Get up at night to urinate?: No Leakage of urine?: No Urine stream starts and stops?: No Trouble starting stream?: No Do you have to strain to urinate?: No Blood in urine?: No Urinary tract infection?: No Sexually transmitted disease?: No Injury to kidneys or bladder?: No Painful intercourse?: No Weak stream?: No Erection problems?: Yes Penile pain?: No  Gastrointestinal Nausea?: No Vomiting?: No Indigestion/heartburn?: No Diarrhea?: Yes Constipation?: Yes  Constitutional Fever: No Night sweats?: No Weight loss?: No Fatigue?: No  Skin Skin rash/lesions?: No Itching?: No  Eyes Blurred vision?: No Double vision?: No  Ears/Nose/Throat Sore throat?: No Sinus problems?: No  Hematologic/Lymphatic Swollen glands?: No Easy bruising?: No  Cardiovascular Leg swelling?: No Chest pain?: No  Respiratory Cough?: No Shortness of breath?: Yes  Endocrine Excessive thirst?: No  Musculoskeletal Back pain?: No Joint pain?: Yes  Neurological Headaches?: No Dizziness?: No  Psychologic Depression?: Yes Anxiety?: Yes  Physical Exam: BP 132/85 (BP Location: Right Arm, Patient Position: Sitting, Cuff Size: Large)   Pulse 76   Ht 6' (1.829 m)   Wt (!) 363 lb 14.4 oz (165.1 kg)   BMI  49.35 kg/m   Constitutional:  Alert and oriented, No acute distress. HEENT: [place] AT, moist mucus membranes.  Trachea midline, no masses. Cardiovascular: No clubbing, cyanosis, or edema. Respiratory: Normal respiratory effort, no increased work of breathing. GI: Abdomen is soft, nontender, nondistended, no abdominal masses GU: High riding scrotum within normal limits; 40 g benign prostate Skin: No rashes, bruises or suspicious lesions. Lymph: No cervical or inguinal adenopathy. Neurologic: Grossly intact, no focal deficits, moving all 4 extremities. Psychiatric: Normal mood and affect.  Laboratory Data: Lab Results  Component Value Date   WBC 10.3 04/08/2017   HGB 14.2 04/08/2017   HCT 42.2 04/08/2017   MCV 90.2 04/08/2017   PLT 272 04/08/2017    Lab Results  Component Value Date   CREATININE 1.00 08/07/2017    No results found for: PSA  No results found for: TESTOSTERONE  No results found for: HGBA1C  Urinalysis    Component Value Date/Time   COLORURINE YELLOW (A) 04/08/2017 1038   APPEARANCEUR HAZY (A) 04/08/2017 1038   LABSPEC 1.013 04/08/2017 1038   PHURINE 5.0 04/08/2017 1038   GLUCOSEU NEGATIVE 04/08/2017 1038   HGBUR NEGATIVE 04/08/2017 [place] 04/08/2017 [place] 04/08/2017 1038   PROTEINUR NEGATIVE 04/08/2017 1038   NITRITE NEGATIVE 04/08/2017 [place] 04/08/2017 1038    Pertinent Imaging: See above  Assessment & Plan: I felt that it was best to order a renal MRI to assess the current mass and not in 6 months as noted. F/up with Dr Erlene Quan  There are no diagnoses linked to this encounter.  No follow-ups on file.  Reece Packer, MD  [facility] [address], [place], [place] [address] [phone]

## 2017-08-21 ENCOUNTER — Ambulatory Visit (HOSPITAL_COMMUNITY)
Admission: RE | Admit: 2017-08-21 | Discharge: 2017-08-21 | Disposition: A | Discharge status: Home / Self Care | Payer: Medicaid Other | Source: Ambulatory Visit | Attending: Urology | Admitting: Urology

## 2017-08-21 DIAGNOSIS — N281 Cyst of kidney, acquired: Secondary | ICD-10-CM | POA: Diagnosis present

## 2017-08-21 DIAGNOSIS — N2889 Other specified disorders of kidney and ureter: Secondary | ICD-10-CM | POA: Insufficient documentation

## 2017-08-21 DIAGNOSIS — K760 Fatty (change of) liver, not elsewhere classified: Secondary | ICD-10-CM | POA: Diagnosis not present

## 2017-08-21 MED ORDER — GADOBENATE DIMEGLUMINE 529 MG/ML IV SOLN
20.0000 mL | Freq: Once | INTRAVENOUS | Status: AC | PRN
Start: 2017-08-21 — End: 2017-08-21
  Administered 2017-08-21: 20 mL via INTRAVENOUS

## 2017-08-26 ENCOUNTER — Ambulatory Visit (INDEPENDENT_AMBULATORY_CARE_PROVIDER_SITE_OTHER): Payer: Medicaid Other | Admitting: Urology

## 2017-08-26 VITALS — BP 139/89 | HR 75 | Ht 72.0 in | Wt 373.0 lb

## 2017-08-26 DIAGNOSIS — R222 Localized swelling, mass and lump, trunk: Secondary | ICD-10-CM

## 2017-08-26 DIAGNOSIS — N2889 Other specified disorders of kidney and ureter: Secondary | ICD-10-CM | POA: Diagnosis not present

## 2017-08-26 NOTE — Progress Notes (Signed)
08/26/2017 2:49 PM   Jeremy Conley 25-Nov-1970 [id number]  Referring provider: [facility], [facility] [place], [place] [address]  Chief Complaint  Patient presents with  . Follow-up    MRI results    HPI: 47 year old morbidly obese male presenting for follow-up of renal MRI.  He was initially referred for further evaluation of an indeterminate renal mass seen on CT scan at [facility].  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  He also has 2 other Bosniak 2 renal cyst on the left which are non-worrisome.    He denies any flank pain or gross hematuria.  No family history of kidney cancer.  His primary complaint today is bulging of his abdominal wall in the midline just above his umbilicus.  He did have a CT of his abdomen at [facility] (unable to view images but no report of abdominal wall hernia).  He is extremely bothered by this and reports that it affects his sex life.  It is also uncomfortable and painful at times.  No associated nausea or vomiting.   PMH: Past Medical History:  Diagnosis Date  . Arthritis   . Arthritis   . Depression   . GERD (gastroesophageal reflux disease)   . Hyperlipidemia   . Hypertension     Surgical History: Past Surgical History:  Procedure Laterality Date  . KNEE ARTHROSCOPY Left 1993 and 1997    Home Medications:  Allergies as of 08/26/2017   No Known Allergies     Medication List        Accurate as of 08/26/17  2:49 PM. Always use your most recent med list.          aspirin EC 81 MG tablet Take 81 mg by mouth daily.   atorvastatin 20 MG tablet Commonly known as:  LIPITOR Take 20 mg by mouth.   clindamycin 1 % external solution Commonly known as:  CLEOCIN T Apply topically to scalp and face daily.   hydrochlorothiazide 25 MG tablet Commonly known as:  HYDRODIURIL Take 25 mg by  mouth.   losartan 50 MG tablet Commonly known as:  COZAAR Take 100 mg by mouth once.   meloxicam 7.5 MG tablet Commonly known as:  MOBIC Take 7.5 mg by mouth.   triamcinolone cream 0.1 % Commonly known as:  KENALOG Apply to rash on arm 1-2 times daily as needed.       Allergies: No Known Allergies  Family History: Family History  Problem Relation Age of Onset  . Heart disease Father   . Bladder Cancer Neg Hx   . Kidney cancer Neg Hx   . Prostate cancer Neg Hx     Social History:  reports that he has never smoked. He has never used smokeless tobacco. He reports that he drinks alcohol. He reports that he does not use drugs.  ROS: UROLOGY Frequent Urination?: No Hard to postpone urination?: No Burning/pain with urination?: No Get up at night to urinate?: No Leakage of urine?: No Urine stream starts and stops?: No Trouble starting stream?: No Do you have to strain to urinate?: No Blood in urine?: No Urinary tract infection?: No Sexually transmitted disease?: No Injury to kidneys or bladder?: No Painful intercourse?: No Weak stream?: No Erection problems?: Yes Penile pain?: No  Gastrointestinal Nausea?: No Vomiting?: Yes Indigestion/heartburn?: Yes Diarrhea?: Yes Constipation?:  Yes  Constitutional Fever: No Night sweats?: Yes Weight loss?: No Fatigue?: Yes  Skin Skin rash/lesions?: Yes Itching?: No  Eyes Blurred vision?: No Double vision?: No  Ears/Nose/Throat Sore throat?: No Sinus problems?: No  Hematologic/Lymphatic Swollen glands?: No Easy bruising?: No  Cardiovascular Leg swelling?: No Chest pain?: No  Respiratory Cough?: No Shortness of breath?: Yes  Endocrine Excessive thirst?: No  Musculoskeletal Back pain?: No Joint pain?: Yes  Neurological Headaches?: No Dizziness?: No  Psychologic Depression?: No Anxiety?: No  Physical Exam: BP 139/89 (BP Location: Left Arm, Patient Position: Sitting, Cuff Size: Large)    Pulse 75   Ht 6' (1.829 m)   Wt (!) 373 lb (169.2 kg)   BMI 50.59 kg/m   Constitutional:  Alert and oriented, No acute distress. HEENT: [place] AT, moist mucus membranes.  Trachea midline, no masses. Cardiovascular: No clubbing, cyanosis, or edema. Respiratory: Normal respiratory effort, no increased work of breathing. GI: Abdomen morbidly obese with tenderness just above his umbilicus.  With Valsalva and leaning forward, there is protuberance in this area with a palpable mass concerning for either rectus diastases versus an abdominal wall hernia. GU: No CVA tenderness Skin: No rashes, bruises or suspicious lesions. Neurologic: Grossly intact, no focal deficits, moving all 4 extremities. Psychiatric: Normal mood and affect.  Laboratory Data: Lab Results  Component Value Date   WBC 10.3 04/08/2017   HGB 14.2 04/08/2017   HCT 42.2 04/08/2017   MCV 90.2 04/08/2017   PLT 272 04/08/2017    Lab Results  Component Value Date   CREATININE 1.00 08/07/2017    Urinalysis N/a  Pertinent Imaging: CLINICAL DATA:  Cystic lesions of the left kidney including a potential enhancing lesion. For further workup.  EXAM: MRI ABDOMEN WITHOUT AND WITH CONTRAST  TECHNIQUE: Multiplanar multisequence MR imaging of the abdomen was performed both before and after the administration of intravenous contrast.  CONTRAST:  16mL MULTIHANCE GADOBENATE DIMEGLUMINE 529 MG/ML IV SOLN  COMPARISON:  We are happy to provide direct comparison to the exam which prompted today's study if it can be provided.  FINDINGS: Body habitus reduces diagnostic sensitivity and specificity.  Lower chest: Unremarkable  Hepatobiliary: Diffuse hepatic steatosis.  Unremarkable  Pancreas:  Unremarkable  Spleen:  Unremarkable  Adrenals/Urinary Tract:  Adrenal glands normal.  The presume lesion of concern along the left kidney lower pole posteriorly has low precontrast T2 signal characteristics and measures 1.0  by 0.9 cm on image 39/6. 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  1.6 by 1.3 cm left mid kidney lesion with high T2 and high T1 signal characteristics and no internal enhancement, compatible with a Bosniak category 2 cyst, shown on image 31/6.  1.0 by 0.8 cm lesion with high T2 and intermediate T1 signal characteristics as shown on image 35/6 demonstrates no internal enhancement, compatible with a Bosniak category 2 cyst.  Approximately 10 mm lesion with low T1 and high T2 signal characteristics in the upper pole the right kidney on image 24/7 demonstrates no significant appreciable enhancement, favoring Bosniak category 1 cyst.  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 I do not see a discrete mass like appearance.  Musculoskeletal: Unremarkable  IMPRESSION: 1. 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 No adenopathy or tumor thrombus in the left renal vein. 2. Two other Bosniak category 2 cysts of the left kidney appear benign. 3. 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 No discrete mass like appearance in this vicinity. 4. Diffuse hepatic steatosis.   Electronically Signed   By: Van Clines M.D.   On: 08/21/2017 15:51  MRI was personally reviewed today and with the patient.  Assessment & Plan:    1. Left renal mass Incidental enhancing once a meter left lower pole renal mass concerning for  possible renal cell carcinoma (likely papillary given radiographic features).  A solid renal mass raises the suspicion of primary renal malignancy.  We discussed this in detail and in regards to the spectrum of renal masses which includes cysts (pure cysts are considered benign), solid masses and everything in between. The risk of metastasis increases as the size of solid renal mass increases. In general, it is believed that the risk of metastasis for renal masses less than 3-4 cm is small (up to approximately 5%) based mainly on large retrospective studies. In some cases and especially in patients of older age and multiple comorbidities a surveillance approach may be appropriate. The treatment of solid renal masses includes: surveillance, cryoablation (percutaneous and laparoscopic) in addition to partial and complete nephrectomy (each with option of laparoscopic, robotic and open depending on appropriateness). Furthermore, nephrectomy appears to be an independent risk factor for the development of chronic kidney disease suggesting that nephron sparing approaches should be implored whenever feasible. We reviewed these options in context of the patients current situation as well as the pros and cons of each.  Given his multiple medical comorbidities for which he is currently undergoing workup and treatment and morbid obesity(BMI 50), he is a suboptimal surgical candidate for high risk surgery for this small neoplasm.  I recommended either surveillance at this time versus consideration of percutaneous intervention in the form of cryoablation.  He would like to discuss this as an option and would like a referral to interventional radiology.  He will follow back up with me if he elects to proceed with surveillance or would like to discuss surgery further.  - Ambulatory referral to Interventional Radiology  2. Abdominal wall mass CT scan report from [facility] reviewed, unable to review actual images but no  report of abdominal wall hernia Clinically, his abdominal wall mass does feel consistent with either rectus diastases at this location versus an occult hernia Will send to general surgery for further evaluation - Ambulatory referral to [facility], [place] [address], [place], [place] [address] [phone]

## 2017-08-28 ENCOUNTER — Other Ambulatory Visit: Payer: Self-pay | Admitting: Radiology

## 2017-08-28 ENCOUNTER — Other Ambulatory Visit: Payer: Self-pay

## 2017-08-28 ENCOUNTER — Telehealth: Payer: Self-pay | Admitting: Radiology

## 2017-08-28 ENCOUNTER — Telehealth: Payer: Self-pay | Admitting: *Deleted

## 2017-08-28 DIAGNOSIS — N2889 Other specified disorders of kidney and ureter: Secondary | ICD-10-CM

## 2017-08-28 NOTE — Telephone Encounter (Signed)
Vickie at [facility] made aware of referral.

## 2017-08-28 NOTE — Telephone Encounter (Signed)
lmom for Jeremy Conley to schedule consult for lft renal mass.Cathren Harsh

## 2017-08-28 NOTE — Telephone Encounter (Signed)
-----   Message from [name], MD sent at 08/27/2017  4:03 PM EDT ----- Referral to [facility] radiology IR

## 2017-09-01 ENCOUNTER — Ambulatory Visit: Payer: Self-pay | Admitting: Surgery

## 2017-09-01 ENCOUNTER — Ambulatory Visit: Payer: Medicaid Other | Admitting: Student in an Organized Health Care Education/Training Program

## 2017-09-03 ENCOUNTER — Other Ambulatory Visit: Payer: Medicaid Other

## 2017-09-03 NOTE — Telephone Encounter (Signed)
Per [facility], pt missed appt for consult & does not want to reschedule at this time. Pt states he has appointment for second opinion at [facility] will call back if he wants to reschedule.

## 2017-09-08 ENCOUNTER — Ambulatory Visit: Payer: Medicaid Other | Admitting: Student in an Organized Health Care Education/Training Program

## 2017-09-10 ENCOUNTER — Ambulatory Visit: Payer: Medicaid Other | Admitting: Urology

## 2017-09-11 ENCOUNTER — Ambulatory Visit (INDEPENDENT_AMBULATORY_CARE_PROVIDER_SITE_OTHER): Payer: Medicaid Other | Admitting: Surgery

## 2017-09-11 ENCOUNTER — Encounter: Payer: Self-pay | Admitting: Surgery

## 2017-09-11 VITALS — BP 130/90 | HR 76 | Temp 98.1°F | Resp 15 | Ht 72.0 in | Wt 364.5 lb

## 2017-09-11 DIAGNOSIS — R1033 Periumbilical pain: Secondary | ICD-10-CM

## 2017-09-11 NOTE — Progress Notes (Signed)
Surgical Clinic History and Physical  Referring provider:  [facility] [address] [place], [place] [address]  HISTORY OF PRESENT ILLNESS (HPI):  47 y.o. male presents for evaluation of an umbilical hernia. Patient states he was told 2 years ago that he has a hiatal hernia and that every time he lays on his hiatal hernia at night, it causes him pain/discomfort and worsened GERD. He identifies his hernia as above his umbilicus and denies constipation or N/V.   2.5 months ago, patient's [facility] family med physician ordered a non-contrast CT to evaluate for an umbilical hernia, but this study instead demonstrated B/L benign L > R renal cysts and a L renal mass concerning for a small papillary renal cell carcinoma. Since then, patient has underwent contrast CT, MRI and urology evaluation. While a recent urology note describes patient as very concerned about his hernia, today patient states his first concern is his possible kidney cancer, followed by his knees, and then his hernia, and he expresses he thought his appointment today was with IR.  PAST MEDICAL HISTORY (PMH):  Past Medical History:  Diagnosis Date  . Arthritis   . Arthritis   . Depression   . GERD (gastroesophageal reflux disease)   . Hyperlipidemia   . Hypertension      PAST SURGICAL HISTORY ([place]):  Past Surgical History:  Procedure Laterality Date  . KNEE ARTHROSCOPY Left 1993 and 1997     MEDICATIONS:  Prior to Admission medications   Medication Sig Start Date End Date Taking? Authorizing Provider  aspirin EC 81 MG tablet Take 81 mg by mouth daily.   Yes [provider]  atorvastatin (LIPITOR) 20 MG tablet Take 20 mg by mouth. 01/16/17 01/16/18 Yes [provider]  clindamycin (CLEOCIN T) 1 % external solution Apply topically to scalp and face daily. 01/14/17  Yes [provider]  hydrochlorothiazide (HYDRODIURIL) 25 MG tablet Take 25 mg by mouth. 01/10/17 01/10/18 Yes [provider]   losartan (COZAAR) 50 MG tablet Take 100 mg by mouth once. 03/12/17 03/12/18 Yes [provider]  meloxicam (MOBIC) 7.5 MG tablet Take 7.5 mg by mouth. 03/12/17  Yes [provider]  Omeprazole 20 MG TBEC Take by mouth. 08/06/17 11/04/17 Yes [provider]  triamcinolone cream (KENALOG) 0.1 % Apply to rash on arm 1-2 times daily as needed. 01/07/17  Yes [provider]     ALLERGIES:  No Known Allergies   SOCIAL HISTORY:  Social History   Socioeconomic History  . Marital status: Single    Spouse name: Not on file  . Number of children: Not on file  . Years of education: Not on file  . Highest education level: Not on file  Occupational History  . Not on file  Social Needs  . Financial resource strain: Not on file  . Food insecurity:    Worry: Not on file    Inability: Not on file  . Transportation needs:    Medical: Not on file    Non-medical: Not on file  Tobacco Use  . Smoking status: Never Smoker  . Smokeless tobacco: Never Used  Substance and Sexual Activity  . Alcohol use: Yes    Comment: occassional  . Drug use: No  . Sexual activity: Not on file  Lifestyle  . Physical activity:    Days per week: Not on file    Minutes per session: Not on file  . Stress: Not on file  Relationships  . Social connections:  Talks on phone: Not on file    Gets together: Not on file    Attends religious service: Not on file    Active member of club or organization: Not on file    Attends meetings of clubs or organizations: Not on file    Relationship status: Not on file  . Intimate partner violence:    Fear of current or ex partner: Not on file    Emotionally abused: Not on file    Physically abused: Not on file    Forced sexual activity: Not on file  Other Topics Concern  . Not on file  Social History Narrative  . Not on file    The patient currently resides (home / rehab facility / nursing home): Home The patient normally is  (ambulatory / bedbound): Ambulatory with cane due to knee arthritis  FAMILY HISTORY:  Family History  Problem Relation Age of Onset  . Heart disease Father   . Bladder Cancer Neg Hx   . Kidney cancer Neg Hx   . Prostate cancer Neg Hx     Otherwise negative/non-contributory.  REVIEW OF SYSTEMS:  Constitutional: denies any other weight loss, fever, chills, or sweats  Eyes: denies any other vision changes, history of eye injury  ENT: denies sore throat, hearing problems  Respiratory: denies shortness of breath, wheezing  Cardiovascular: denies chest pain, palpitations  Gastrointestinal: abdominal pain, N/V, and bowel function as per HPI Musculoskeletal: denies any other joint pains or cramps  Skin: Denies any other rashes or skin discolorations Neurological: denies any other headache, dizziness, weakness  Psychiatric: Denies any other depression, anxiety   All other review of systems were otherwise negative   VITAL SIGNS:  BP 130/90   Pulse 76   Temp 98.1 F (36.7 C)   Resp 15   Ht 6' (1.829 m)   Wt (!) 364 lb 8 oz (165.3 kg)   BMI 49.44 kg/m   PHYSICAL EXAM:  Constitutional:  -- Morbidly obese body habitus  -- Awake, alert, and oriented x3  Eyes:  -- Pupils equally round and reactive to light  -- No scleral icterus  Ear, nose, throat:  -- No jugular venous distension -- No nasal drainage, bleeding Pulmonary:  -- No crackles  -- Equally decreased breath sounds bilaterally -- Breathing non-labored at rest Cardiovascular:  -- S1, S2 present  -- No pericardial rubs  Gastrointestinal:  -- Abdomen soft and obese, but not obviously distended, with minimal supraumbilical tenderness to even deep palpation, no guarding/rebound  -- No abdominal masses appreciated, pulsatile or otherwise, though possible subtle supraumbilical bulge (difficult to appreciated considering body habitus)  Musculoskeletal and Integumentary:  -- Wounds or skin discoloration: None appreciate --  Extremities: B/L UE and LE FROM, hands and feet warm, uses cane to ambulate  Neurologic:  -- Motor function: Intact and symmetric -- Sensation: Intact and symmetric  Labs:  CBC Latest Ref Rng & Units 04/08/2017  WBC 3.8 - 10.6 K/uL 10.3  Hemoglobin 13.0 - 18.0 g/dL 14.2  Hematocrit 40.0 - 52.0 % 42.2  Platelets 150 - 440 K/uL 272   CMP Latest Ref Rng & Units 08/07/2017 04/08/2017  Glucose 65 - 99 mg/dL - 129(H)  BUN 6 - 20 mg/dL - 17  Creatinine 0.61 - 1.24 mg/dL 1.00 0.80  Sodium 135 - 145 mmol/L - 130(L)  Potassium 3.5 - 5.1 mmol/L - 3.5  Chloride 101 - 111 mmol/L - 96(L)  CO2 22 - 32 mmol/L - 24  Calcium 8.9 -  10.3 mg/dL - 9.2    Imaging studies:  CT Abdomen and Pelvis without Contrast (06/27/2017) -No hernia detected. -Exophytic masses in the left kidney are not well characterized on today's noncontrasted. -Mild mesenteric stranding with prominent subcentimeter intermediate attenuation nodules are noted in the mid abdomen, nonspecific. Differential includes mesenteric panniculitis.   CT Abdomen and Pelvis with Contrast (07/18/2017) - images personally reviewed and discussed with patient, possible circular consolidated supraumbilical abdominal wall fat, possibly suggestive of a lipoma, but no appreciable peri-umbilical anterior abdominal wall fascial defect Redemonstrated exophytic lesion of the left kidney. The 2 interpolar lesions are consistent with renal cysts. The left inferior pole lesion demonstrates 34 Hounsfield units of enhancement between pre and delayed postcontrast imaging and is thus worrisome for small enhancing renal mass. Recommend urology consultation. If follow-up imaging is to be performed, consider contrast-enhanced renal MRI in 6 months.  MRI Abdomen with Contrast (08/21/2017) - images personally reviewed with patient, possible circular consolidated supraumbilical abdominal wall fat, possibly suggestive of a lipoma, but no appreciable peri-umbilical anterior  abdominal wall fascial defect 1. 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 No adenopathy or tumor thrombus in the left renal vein. 2. Two other Bosniak category 2 cysts of the left kidney appear benign. 3. 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 No discrete mass like appearance in this vicinity. 4. Diffuse hepatic steatosis.  Assessment/Plan:  47 y.o. male with no appreciable fascial defect on CT or MRI and uncertain whether supraumbilical mass or soft tissue abnormality at all due in part to morbidly obese body habitus, complicated by B/L L > R likely benign renal cysts and Left renal mass concerning for small papillary renal cell carcinoma along with co-morbidities including morbid obesity (BMI 50), HTN, HLD, GERD (no mention of hiatal hernia on prior medical office notes or imaging studies), osteoarthritis, and major depression disorder.   - management of renal mass as per urology, oncology, and IR   - no general surgical intervention indicated at this time   - discussed with patient umbilical hernias and hiatal hernias  - consider PPI for symptoms suggestive of GERD  - weight loss strongly encouraged, though activity limited due to B/L knee pain   - return to clinic as needed, instructed to call if any questions or concerns  All of the above recommendations were discussed with the patient, and all of patient's questions were answered to his expressed satisfaction.  Thank you for the opportunity to participate in this patient's care.  -- Marilynne Drivers Rosana Hoes, MD, [place]: [facility] - Partnering for exceptional care. Office: [phone]

## 2017-09-11 NOTE — Patient Instructions (Signed)
You may call us about your hernia repair once you have been seen about your kidneys. In the meantime we will look at your CT scans that have been done.   Laparoscopic Ventral Hernia Repair Laparoscopic ventral hernia repairis a procedure to fix a bulge of tissue that pushes through a weak area of muscle in the abdomen (ventral hernia). A ventral hernia may be at the belly button (umbilical), above the belly button (epigastric), or at the incision site from previous abdominal surgery (incisional hernia). You may have this procedure as emergency surgery if part of your intestine gets trapped inside the hernia and starts to lose its blood supply (strangulation). Laparoscopic surgery is done through small incisions using a thin surgical telescope with a light and camera on the end (laparoscope). During surgery, your surgeon will use images from the laparoscope to guide the procedure. A mesh screen will be placed in the hernia to close the opening and strengthen the abdominal wall. Tell a health care provider about:  Any allergies you have.  All medicines you are taking, including vitamins, herbs, eye drops, creams, and over-the-counter medicines.  Any problems you or family members have had with anesthetic medicines.  Any blood disorders you have.  Any surgeries you have had.  Any medical conditions you have.  Whether you are pregnant or may be pregnant. What are the risks? Generally, this is a safe procedure. However, problems may occur, including:  Infection.  Bleeding.  Allergic reactions to medicines.  Damage to other structures or organs in the abdomen.  Trouble urinating or having a bowel movement after surgery.  Pneumonia.  Blood clots.  The hernia coming back after surgery.  Fluid buildup in the area of the hernia.  In some cases, your health care provider may need to switch from a laparoscopic procedure to a procedure that is done through a single, larger incision in the  abdomen (open procedure). You may need an open procedure if:  You have a hernia that is difficult to repair.  Your organs are hard to see.  You have bleeding problems during the laparoscopic procedure.  What happens before the procedure? Staying hydrated Follow instructions from your health care provider about hydration, which may include:  Up to 2 hours before the procedure - you may continue to drink clear liquids, such as water, clear fruit juice, black coffee, and plain tea.  Eating and drinking restrictions Follow instructions from your health care provider about eating and drinking, which may include:  8 hours before the procedure - stop eating heavy meals or foods such as meat, fried foods, or fatty foods.  6 hours before the procedure - stop eating light meals or foods, such as toast or cereal.  6 hours before the procedure - stop drinking milk or drinks that contain milk.  2 hours before the procedure - stop drinking clear liquids.  Medicines  Ask your health care provider about: ? Changing or stopping your regular medicines. This is especially important if you are taking diabetes medicines or blood thinners. ? Taking medicines such as aspirin and ibuprofen. These medicines can thin your blood. Do not take these medicines before your procedure if your health care provider instructs you not to.  You may be given antibiotic medicine to help prevent infection. General instructions  You may be asked to take a laxative or do an enema to empty your bowel before surgery (bowel prep).  Do not use any products that contain nicotine or tobacco, such as  cigarettes and e-cigarettes. If you need help quitting, ask your health care provider.  You may need to have tests before the procedure, such as: ? Blood tests. ? Urine tests. ? Abdominal ultrasound. ? Chest X-ray. ? Electrocardiogram (ECG).  Plan to have someone take you home from the hospital or clinic.  If you will be  going home right after the procedure, plan to have someone with you for 24 hours. What happens during the procedure?  To reduce your risk of infection: ? Your health care team will wash or sanitize their hands. ? Your skin will be washed with soap.  An IV tube will be inserted into one of your veins.  You will be given one or more of the following: ? A medicine to help you relax (sedative). ? A medicine to make you fall asleep (general anesthetic).  A small incision will be made in your abdomen. A hollow metal tube (trocar) will be placed through the incision.  A tube will be placed through the trocar to inflate your abdomen with air-like gas. This makes it easier for your surgeon to see inside your abdomen and do the repair.  The laparoscope will be inserted into your abdomen through the trocar. The laparoscope will send images to a monitor in the operating room.  Other trocars will be put through other small incisions in your abdomen. The surgical instruments needed for the procedure will be placed through these trocars.  The tissue or intestines that make up the hernia will be moved back into place.  The edges of the hernia may be stitched together.  A piece of mesh will be used to close the hernia. Stitches (sutures), clips, or staples will be used to keep the mesh in place.  A bandage (dressing) or skin glue will be put over the incisions. The procedure may vary among health care providers and hospitals. What happens after the procedure?  Your blood pressure, heart rate, breathing rate, and blood oxygen level will be monitored until the medicines you were given have worn off.  You will continue to receive fluids and medicines through an IV tube. Your IV tube will be removed when you can drink clear fluids.  You will be given pain medicine as needed.  You will be encouraged to get up and walk around as soon as possible.  You may have to wear compression stockings. These  stockings help to prevent blood clots and reduce swelling in your legs.  You will be shown how to do deep breathing exercises to help prevent a lung infection.  Do not drive for 24 hours if you were given a sedative. This information is not intended to replace advice given to you by your health care provider. Make sure you discuss any questions you have with your health care provider. Document Released: 04/22/2012 Document Revised: 12/22/2015 Document Reviewed: 12/22/2015 Elsevier Interactive Patient Education  2018 [place], Adult A hernia is the bulging of an organ or tissue through a weak spot in the muscles of the abdomen (abdominal wall). Hernias develop most often near the navel or groin. There are many kinds of hernias. Common kinds include:  Femoral hernia. This kind of hernia develops under the groin in the upper thigh area.  Inguinal hernia. This kind of hernia develops in the groin or scrotum.  Umbilical hernia. This kind of hernia develops near the navel.  Hiatal hernia. This kind of hernia causes part of the stomach to be pushed up into the  chest.  Incisional hernia. This kind of hernia bulges through a scar from an abdominal surgery.  What are the causes? This condition may be caused by:  Heavy lifting.  Coughing over a long period of time.  Straining to have a bowel movement.  An incision made during an abdominal surgery.  A birth defect (congenital defect).  Excess weight or obesity.  Smoking.  Poor nutrition.  Cystic fibrosis.  Excess fluid in the abdomen.  Undescended testicles.  What are the signs or symptoms? Symptoms of a hernia include:  A lump on the abdomen. This is the first sign of a hernia. The lump may become more obvious with standing, straining, or coughing. It may get bigger over time if it is not treated or if the condition causing it is not treated.  Pain. A hernia is usually painless, but it may become painful over  time if treatment is delayed. The pain is usually dull and may get worse with standing or lifting heavy objects.  Sometimes a hernia gets tightly squeezed in the weak spot (strangulated) or stuck there (incarcerated) and causes additional symptoms. These symptoms may include:  Vomiting.  Nausea.  Constipation.  Irritability.  How is this diagnosed? A hernia may be diagnosed with:  A physical exam. During the exam your health care provider may ask you to cough or to make a specific movement, because a hernia is usually more visible when you move.  Imaging tests. These can include: ? X-rays. ? Ultrasound. ? CT scan.  How is this treated? A hernia that is small and painless may not need to be treated. A hernia that is large or painful may be treated with surgery. Inguinal hernias may be treated with surgery to prevent incarceration or strangulation. Strangulated hernias are always treated with surgery, because lack of blood to the trapped organ or tissue can cause it to die. Surgery to treat a hernia involves pushing the bulge back into place and repairing the weak part of the abdomen. Follow these instructions at home:  Avoid straining.  Do not lift anything heavier than 10 lb (4.5 kg).  Lift with your leg muscles, not your back muscles. This helps avoid strain.  When coughing, try to cough gently.  Prevent constipation. Constipation leads to straining with bowel movements, which can make a hernia worse or cause a hernia repair to break down. You can prevent constipation by: ? Eating a high-fiber diet that includes plenty of fruits and vegetables. ? Drinking enough fluids to keep your urine clear or pale yellow. Aim to drink 6-8 glasses of water per day. ? Using a stool softener as directed by your health care provider.  Lose weight, if you are overweight.  Do not use any tobacco products, including cigarettes, chewing tobacco, or electronic cigarettes. If you need help  quitting, ask your health care provider.  Keep all follow-up visits as directed by your health care provider. This is important. Your health care provider may need to monitor your condition. Contact a health care provider if:  You have swelling, redness, and pain in the affected area.  Your bowel habits change. Get help right away if:  You have a fever.  You have abdominal pain that is getting worse.  You feel nauseous or you vomit.  You cannot push the hernia back in place by gently pressing on it while you are lying down.  The hernia: ? Changes in shape or size. ? Is stuck outside the abdomen. ? Becomes discolored. ?  Feels hard or tender. This information is not intended to replace advice given to you by your health care provider. Make sure you discuss any questions you have with your health care provider. Document Released: 05/06/2005 Document Revised: 10/04/2015 Document Reviewed: 03/16/2014 Elsevier Interactive Patient Education  2017 [employer].

## 2017-09-12 ENCOUNTER — Encounter: Payer: Self-pay | Admitting: Surgery

## 2017-11-11 NOTE — Telephone Encounter (Signed)
Patient states he would like to reschedule Interventional Radiology Consult appointment. Made Jocelyn Lamer at [facility] aware. She will call patient to schedule appointment.

## 2017-11-12 NOTE — Telephone Encounter (Signed)
He said the person he was supposed to see at [facility] wasn't working there anymore so he wants to go ahead & proceed here. Does he need to see you before the IR consult? His consult has already been scheduled for 11/19/2017.

## 2017-11-12 NOTE — Telephone Encounter (Signed)
No, just see IR.  Just wanted to make sure he has appropriate follow up to address this tumor.    [name], MD

## 2017-11-12 NOTE — Telephone Encounter (Signed)
When should he be seen here next?

## 2017-11-12 NOTE — Telephone Encounter (Signed)
I do not see that she seen [facility] for second opinion yet.  Please ensure that she has follow-up with someone, either me or [facility].  [name], MD

## 2017-11-12 NOTE — Telephone Encounter (Signed)
Lets just wait to see what interventional radiologist says/ does.  [name], MD

## 2017-11-19 ENCOUNTER — Encounter: Payer: Self-pay | Admitting: Radiology

## 2017-11-19 ENCOUNTER — Ambulatory Visit
Admission: RE | Admit: 2017-11-19 | Discharge: 2017-11-19 | Disposition: A | Discharge status: Home / Self Care | Payer: Medicaid Other | Source: Ambulatory Visit | Attending: Urology | Admitting: Urology

## 2017-11-19 DIAGNOSIS — N2889 Other specified disorders of kidney and ureter: Secondary | ICD-10-CM

## 2017-11-19 HISTORY — PX: IR RADIOLOGIST EVAL & MGMT: IMG5224

## 2017-11-19 NOTE — Consult Note (Signed)
Patient was seen in consultation today for  Chief Complaint  Patient presents with  . Consult    Consult for Left Renal Mass   at the request of [place]  Referring Physician(s): Brandon,Ashley  History of Present Illness: Jeremy Conley is a 47 y.o. male with past medical history significant for hypertension, hyperlipidemia and morbid obesity who was found to have a indeterminate left-sided renal lesion on outside abdominal CT performed for the work-up of a palpable knot within his mid abdomen (which the patient states was ultimately attributable to a gastritis).  The patient subsequently underwent contrast-enhanced abdominal MRI on 08/21/2017 which confirmed the presence of an approximately 1.0 cm partially exophytic lesion arising from the posterior inferior pole of the left kidney worrisome for a renal cell carcinoma.  Note is also made of a bilateral nonenhancing renal cysts.  Patient was seen in consultation by Dr. Erlene Quan [facility] urology), but was deemed a poor operative candidate given his morbid obesity.  As such, the patient presents with his mother for evaluation of potential left-sided renal cryoablation.  Patient is asymptomatic in regards to his incidentally discovered left-sided renal lesion.  Specifically, no flank pain or hematuria or nocturia.  No unintentional weight loss.  Energy level is unchanged.    Past Medical History:  Diagnosis Date  . Arthritis   . Arthritis   . Depression   . GERD (gastroesophageal reflux disease)   . Hyperlipidemia   . Hypertension     Past Surgical History:  Procedure Laterality Date  . KNEE ARTHROSCOPY Left 1993 and 1997    Allergies: Patient has no known allergies.  Medications: Prior to Admission medications   Medication Sig Start Date End Date Taking? Authorizing Provider  aspirin EC 81 MG tablet Take 81 mg by mouth daily.   Yes [provider]  atorvastatin (LIPITOR) 20 MG tablet Take 20 mg by  mouth. 01/16/17 01/16/18 Yes [provider]  clindamycin (CLEOCIN T) 1 % external solution Apply topically to scalp and face daily. 01/14/17  Yes [provider]  ergocalciferol (VITAMIN D2) 50000 units capsule Take 50,000 Units by mouth once a week.   Yes [provider]  hydrochlorothiazide (HYDRODIURIL) 25 MG tablet Take 25 mg by mouth. 01/10/17 01/10/18 Yes [provider]  losartan (COZAAR) 50 MG tablet Take 100 mg by mouth once. 03/12/17 03/12/18 Yes [provider]  meloxicam (MOBIC) 7.5 MG tablet Take 7.5 mg by mouth. 03/12/17  Yes [provider]  triamcinolone cream (KENALOG) 0.1 % Apply to rash on arm 1-2 times daily as needed. 01/07/17  Yes [provider]  Omeprazole 20 MG TBEC Take by mouth. 08/06/17 11/04/17  [provider]     Family History  Problem Relation Age of Onset  . Heart disease Father   . Bladder Cancer Neg Hx   . Kidney cancer Neg Hx   . Prostate cancer Neg Hx     Social History   Socioeconomic History  . Marital status: Single    Spouse name: Not on file  . Number of children: Not on file  . Years of education: Not on file  . Highest education level: Not on file  Occupational History  . Not on file  Social Needs  . Financial resource strain: Not on file  . Food insecurity:    Worry: Not on file    Inability: Not on file  . Transportation needs:    Medical: Not on file    Non-medical:  Not on file  Tobacco Use  . Smoking status: Never Smoker  . Smokeless tobacco: Never Used  Substance and Sexual Activity  . Alcohol use: Yes    Comment: occassional  . Drug use: No  . Sexual activity: Not on file  Lifestyle  . Physical activity:    Days per week: Not on file    Minutes per session: Not on file  . Stress: Not on file  Relationships  . Social connections:    Talks on phone: Not on file    Gets together: Not on file    Attends religious service: Not on file    Active member of  club or organization: Not on file    Attends meetings of clubs or organizations: Not on file    Relationship status: Not on file  Other Topics Concern  . Not on file  Social History Narrative  . Not on file    ECOG Status: 0 - Asymptomatic  Review of Systems: A 12 point ROS discussed and pertinent positives are indicated in the HPI above.  All other systems are negative.  Review of Systems  Constitutional: Negative for activity change, appetite change and fever.  Genitourinary: Negative for flank pain and hematuria.    Vital Signs: BP 137/71   Pulse 83   Temp 98.2 F (36.8 C) (Oral)   Resp 16   Ht 6' (1.829 m)   Wt (!) 340 lb (154.2 kg)   SpO2 96%   BMI 46.11 kg/m   Physical Exam  Constitutional: He appears well-developed and well-nourished.  Morbidly obese  Nursing note and vitals reviewed.     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   Labs:  CBC: Recent Labs    04/08/17 1038  WBC 10.3  HGB 14.2  HCT 42.2  PLT 272    COAGS: No results for input(s): INR, APTT in the last 8760 hours.  BMP: Recent Labs    04/08/17 1038 08/07/17 1519  NA 130*  --   K 3.5  --   CL 96*  --   CO2 24  --   GLUCOSE 129*  --   BUN 17  --   CALCIUM 9.2  --   CREATININE 0.80 1.00  GFRNONAA >60  --   GFRAA >60  --     LIVER FUNCTION TESTS: No results for input(s): BILITOT, AST, ALT, ALKPHOS, PROT, ALBUMIN in the last 8760 hours.  TUMOR MARKERS: No results for input(s): AFPTM, CEA, CA199, CHROMGRNA in the last 8760 hours.  Assessment and Plan:  Jeremy Conley is a 47 y.o. male with past medical history significant for hypertension, hyperlipidemia and morbid obesity who was found to have a indeterminate left-sided renal lesion on outside abdominal CT performed for the work-up of a palpable  abdominal not.  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  Patient is asymptomatic in regards to this is a newly discovered left-sided renal lesion.  I explained in great detail to the patient and the patient's mother that given the high diagnostic accuracy of abdominal MRI for renal cell carcinoma, biopsy is often deferred given risk of bleeding and potential tract seeding.  Prolonged conversations were held with the patient regarding the natural history of papillary renal cell carcinoma and the fact that the lesions often are indolent with very slow growth however some may behave aggressively.    I explained to her that the definitive treatment would be a partial nephrectomy however given his body habitus, he has been deemed a poor operative candidate.  As such, prolonged conversations were had with the patient and the patient's mother regarding the benefits and risks (including but not limited to failure to treat the entire lesion, bleeding, infection, damage to adjacent structures, decrease in renal function and/or postprocedural neuropathy/pain) of renal cryoablation.  I explained that given the small size (1 cm) and location (exophytic, posterior inferior), he is a good candidate for renal cryoablation, however the lesion is small and an additional treatment strategy could entail continued surveillance.  Following this prolonged the detailed conversation, the patient wishes to pursue observation.  As such, I will see the patient in follow consultation following the acquisition of an abdominal MRI in October 2019 (6 months following the April MRI).   In the meantime, patient was encouraged to attempt to lose weight.  The patient and the patient's mother demonstrated excellent  understanding of this conversation and know to call the interventional radiology clinic with any interval questions or concerns.  Thank you for this interesting consult.  I greatly enjoyed meeting Jeremy Conley and look forward to participating in their care.  A copy of this report was sent to the requesting provider on this date.  Electronically Signed: Sandi Mariscal 11/19/2017, 8:59 AM   I spent a total of 30 Minutes in face to face in clinical consultation, greater than 50% of which was counseling/coordinating care for left sided renal lesion

## 2017-12-04 ENCOUNTER — Ambulatory Visit: Payer: Medicaid Other | Admitting: Student in an Organized Health Care Education/Training Program

## 2018-02-05 ENCOUNTER — Other Ambulatory Visit: Payer: Self-pay | Admitting: Interventional Radiology

## 2018-02-05 ENCOUNTER — Other Ambulatory Visit (HOSPITAL_COMMUNITY): Payer: Self-pay | Admitting: Interventional Radiology

## 2018-02-05 DIAGNOSIS — N2889 Other specified disorders of kidney and ureter: Secondary | ICD-10-CM

## 2018-02-10 ENCOUNTER — Other Ambulatory Visit: Payer: Self-pay | Admitting: Radiology

## 2018-02-10 ENCOUNTER — Encounter: Payer: Self-pay | Admitting: Radiology

## 2018-03-11 ENCOUNTER — Ambulatory Visit (HOSPITAL_COMMUNITY): Admission: RE | Admit: 2018-03-11 | Payer: Medicaid Other | Source: Ambulatory Visit

## 2018-03-11 ENCOUNTER — Other Ambulatory Visit: Payer: Medicaid Other

## 2018-03-24 ENCOUNTER — Ambulatory Visit (HOSPITAL_COMMUNITY)
Admission: RE | Admit: 2018-03-24 | Discharge: 2018-03-24 | Disposition: A | Discharge status: Home / Self Care | Payer: Medicaid Other | Source: Ambulatory Visit | Attending: Interventional Radiology | Admitting: Interventional Radiology

## 2018-03-24 ENCOUNTER — Ambulatory Visit
Admission: RE | Admit: 2018-03-24 | Discharge: 2018-03-24 | Disposition: A | Discharge status: Home / Self Care | Payer: Medicaid Other | Source: Ambulatory Visit | Attending: Interventional Radiology | Admitting: Interventional Radiology

## 2018-03-24 ENCOUNTER — Encounter: Payer: Self-pay | Admitting: Radiology

## 2018-03-24 DIAGNOSIS — N2889 Other specified disorders of kidney and ureter: Secondary | ICD-10-CM | POA: Diagnosis present

## 2018-03-24 DIAGNOSIS — K760 Fatty (change of) liver, not elsewhere classified: Secondary | ICD-10-CM | POA: Insufficient documentation

## 2018-03-24 DIAGNOSIS — N281 Cyst of kidney, acquired: Secondary | ICD-10-CM | POA: Insufficient documentation

## 2018-03-24 DIAGNOSIS — C642 Malignant neoplasm of left kidney, except renal pelvis: Secondary | ICD-10-CM | POA: Diagnosis not present

## 2018-03-24 HISTORY — PX: IR RADIOLOGIST EVAL & MGMT: IMG5224

## 2018-03-24 LAB — CREATININE, SERUM
CREATININE: 1.15 mg/dL (ref 0.61–1.24)
GFR calc Af Amer: >60 mL/min (ref >60)

## 2018-03-24 MED ORDER — GADOBUTROL 1 MMOL/ML IV SOLN
10.0000 mL | Freq: Once | INTRAVENOUS | Status: AC | PRN
  Administered 2018-03-24: 10 mL via INTRAVENOUS

## 2018-03-24 NOTE — Progress Notes (Signed)
Patient ID: Jeremy Conley, male   DOB: 1970-11-05, 47 y.o.   MRN: [id number]         Chief Complaint: Left sided renal lesion  Referring Physician(s): Erlene Quan (Urology)  History of Present Illness: Jeremy Conley is a 47 y.o. male with past medical history significant for hypertension, hyperlipidemia and morbid obesity who was found to have an indeterminate left-sided renal lesion on outside abdominal CT performed for the work-up of a palpable knot within his mid abdomen (which was ultimately attributable to gastritis).  Patient was initially seen in the interventional radiology clinic on 11/19/2017 following the acquisition of a contrast-enhanced abdominal MRI performed 08/21/2017 confirming the presence of an approximately 1 cm partially exophytic enhancing lesion from the posterior inferior pole left kidney worrisome for a renal cell carcinoma.  At that time, the patient wished to pursue conservative management and as such, returns today following the acquisition of 32-month surveillance abdominal MRI performed earlier today per  Patient is unaccompanied and serves as his own historian.  Patient remains asymptomatic in regards to the left-sided renal lesion.  Specifically, he denies flank pain or hematuria.  Patient states he has lost about 20 pounds since his last appointment.   Past Medical History:  Diagnosis Date  . Arthritis   . Arthritis   . Depression   . GERD (gastroesophageal reflux disease)   . Hyperlipidemia   . Hypertension     Past Surgical History:  Procedure Laterality Date  . IR RADIOLOGIST EVAL & MGMT  11/19/2017  . KNEE ARTHROSCOPY Left 1993 and 1997    Allergies: Patient has no known allergies.  Medications: Prior to Admission medications   Medication Sig Start Date End Date Taking? Authorizing Provider  aspirin EC 81 MG tablet Take 81 mg by mouth daily.   Yes [provider]  atorvastatin (LIPITOR) 20 MG tablet Take 20 mg by mouth. 01/16/17  03/24/18 Yes [provider]  clindamycin (CLEOCIN T) 1 % external solution Apply topically to scalp and face daily. 01/14/17  Yes [provider]  meloxicam (MOBIC) 7.5 MG tablet Take 7.5 mg by mouth. 03/12/17  Yes [provider]  triamcinolone cream (KENALOG) 0.1 % Apply to rash on arm 1-2 times daily as needed. 01/07/17  Yes [provider]  ergocalciferol (VITAMIN D2) 50000 units capsule Take 50,000 Units by mouth once a week.    [provider]  Omeprazole 20 MG TBEC Take by mouth. 08/06/17 11/04/17  [provider]     Family History  Problem Relation Age of Onset  . Heart disease Father   . Bladder Cancer Neg Hx   . Kidney cancer Neg Hx   . Prostate cancer Neg Hx     Social History   Socioeconomic History  . Marital status: Single    Spouse name: Not on file  . Number of children: Not on file  . Years of education: Not on file  . Highest education level: Not on file  Occupational History  . Not on file  Social Needs  . Financial resource strain: Not on file  . Food insecurity:    Worry: Not on file    Inability: Not on file  . Transportation needs:    Medical: Not on file    Non-medical: Not on file  Tobacco Use  . Smoking status: Never Smoker  . Smokeless tobacco: Never Used  Substance and Sexual Activity  . Alcohol use: Yes    Comment: occassional  . Drug  use: No  . Sexual activity: Not on file  Lifestyle  . Physical activity:    Days per week: Not on file    Minutes per session: Not on file  . Stress: Not on file  Relationships  . Social connections:    Talks on phone: Not on file    Gets together: Not on file    Attends religious service: Not on file    Active member of club or organization: Not on file    Attends meetings of clubs or organizations: Not on file    Relationship status: Not on file  Other Topics Concern  . Not on file  Social History Narrative  . Not on file    ECOG Status: 0 -  Asymptomatic  Review of Systems: A 12 point ROS discussed and pertinent positives are indicated in the HPI above.  All other systems are negative.  Review of Systems  Constitutional: Negative for activity change, fatigue and unexpected weight change.  Respiratory: Negative.   Cardiovascular: Negative.   Genitourinary: Negative for flank pain and hematuria.    Vital Signs: BP (!) 128/94   Pulse 99   Temp 98.2 F (36.8 C) (Oral)   Resp 18   Ht 6' (1.829 m)   SpO2 96%   BMI 46.11 kg/m   Physical Exam  Constitutional: He appears well-developed and well-nourished.  HENT:  Head: Normocephalic and atraumatic.  Skin: Skin is warm and dry.  Psychiatric: He has a normal mood and affect. His behavior is normal.  Nursing note and vitals reviewed.   Imaging:  Selected images from abdominal MRI performed earlier today and 08/21/2017 were reviewed in detail with the patient.  Images demonstrate slight enlargement of the now approximately 1.6 x 1.5 cm partially enhancing lesion arising from the posterior inferior pole of the left kidney (best seen on image 19, series 7), previously, 1.2 x 1.1 cm.  Additional renal lesions are felt to represent simple or minimally complicated renal cysts.  [name] Contrast  Result Date: 03/24/2018 CLINICAL DATA:  Follow-up left renal mass. EXAM: MRI ABDOMEN WITHOUT AND WITH CONTRAST TECHNIQUE: Multiplanar multisequence MR imaging of the abdomen was performed both before and after the administration of intravenous contrast. CONTRAST:  10 cc Gadavist IV. COMPARISON:  08/21/2017 MRI abdomen. FINDINGS: Postcontrast sequences are significantly motion degraded, limiting assessment. Lower chest: No acute abnormality at the lung bases. Hepatobiliary: Normal liver size and configuration. There is marked diffuse hepatic steatosis on chemical shift imaging. No liver mass. Normal gallbladder with no cholelithiasis. No biliary ductal dilatation. Common bile duct  diameter 4 mm. No choledocholithiasis. Pancreas: No pancreatic mass or duct dilation.  No pancreas divisum. Spleen: Normal size. No mass. Adrenals/Urinary Tract: Normal adrenals. No hydronephrosis. There is a 1.6 x 1.5 cm renal cortical mass in the posterior lower left kidney (series 7/image 19), which demonstrates heterogeneous T2 signal intensity and heterogeneous enhancement, increased from 1.0 x 0.9 cm on 08/21/2017 MRI. Simple small upper right renal cysts, largest 1.1 cm. Two similar small exophytic left renal cortical lesions measuring 1.6 cm (series 7/image 12) and 1.1 cm (series 7/image 15) are stable in size since 08/21/2017 MRI and demonstrate slight T1 hyperintensity and no convincing enhancement, compatible with Bosniak category 2 hemorrhagic/proteinaceous renal cysts. Stomach/Bowel: Normal non-distended stomach. Visualized small and large bowel is normal caliber, with no bowel wall thickening. Vascular/Lymphatic: Normal caliber abdominal aorta. Patent portal, splenic, hepatic and renal veins. No tumor thrombus in the renal veins. No pathologically enlarged  lymph nodes in the abdomen. Other: No abdominal ascites or focal fluid collection. Musculoskeletal: No aggressive appearing focal osseous lesions. IMPRESSION: 1. Mild interval growth of enhancing renal cortical mass in the posterior lower left kidney, now 1.6 x 1.5 cm, most compatible with renal cell carcinoma. 2. Patent renal veins with no tumor thrombus. No evidence of metastatic disease in the abdomen. 3. Small Bosniak category 1 and category 2 renal cysts. 4. Marked diffuse hepatic steatosis. Electronically Signed   By: Ilona Sorrel M.D.   On: 03/24/2018 15:49    Labs:  CBC: Recent Labs    04/08/17 1038  WBC 10.3  HGB 14.2  HCT 42.2  PLT 272    COAGS: No results for input(s): INR, APTT in the last 8760 hours.  BMP: Recent Labs    04/08/17 1038 08/07/17 1519 03/24/18 1330  NA 130*  --   --   K 3.5  --   --   CL 96*  --    --   CO2 24  --   --   GLUCOSE 129*  --   --   BUN 17  --   --   CALCIUM 9.2  --   --   CREATININE 0.80 1.00 1.15  GFRNONAA >60  --  >60  GFRAA >60  --  >60    LIVER FUNCTION TESTS: No results for input(s): BILITOT, AST, ALT, ALKPHOS, PROT, ALBUMIN in the last 8760 hours.  TUMOR MARKERS: No results for input(s): AFPTM, CEA, CA199, CHROMGRNA in the last 8760 hours.  Assessment and Plan:  Jeremy Conley is a 47 y.o. male with past medical history significant for hypertension, hyperlipidemia and morbid obesity who was found to have an indeterminate left-sided renal lesion on outside abdominal CT performed for the work-up of a palpable knot within his mid abdomen (which was ultimately attributable to gastritis) and ultimately found to represent a enhancing 1 cm left-sided renal lesion worrisome for renal cell carcinoma.   Patient remains asymptomatic in regards to this incidentally discovered left-sided renal lesion.    Selected images from abdominal MRI performed earlier today and 08/21/2017 were reviewed in detail with the patient.  Images demonstrate slight enlargement of the now approximately 1.6 x 1.5 cm partially enhancing lesion arising from the posterior inferior pole of the left kidney (best seen on image 19, series 7), previously, 1.2 x 1.1 cm.  Additional renal lesions are felt to represent simple or minimally complicated renal cysts.  Imaging suggests a growth pattern of slightly less than 1 mm/month.  I explained that the left-sided renal lesion demonstrates imaging features compatible with a renal cell carcinoma and as such, biopsy is not required.  Potential treatment options were discussed at length with the patient including continued conservative management versus proceeding with left-sided renal cryoablation.  Risks and benefits of image guided left renal cryoablation was discussed with the patient including, but not limited to failure to treat entire lesion, bleeding,  infection, damage to adjacent structures, decrease in renal function or post procedural neuropathy.  All of the patient's questions were answered, and the patient wishes to continue to pursue continued conservative management.    I feel this is a reasonable option as imaging suggests a growth pattern of slightly less than 1 mm/month making it very unlikely the lesion will grow to larger than 3 cm during the next 50-month surveillance interval (lesion less than 3 cm are preferred for cryoablation as lesion burden 3 cm are associated with increased chances  of incomplete cryoablation).  As such, I will see the patient in follow-up consultation following the acquisition of next surveillance abdominal MRI in 6 months (early April 2020).  The patient states he has lost 20 pounds since his last appointment.  I encouraged the patient to lose more weight with goal of having his weight below 300 pounds by the time of his next 6 months appointment.  Patient knows to call the interventional radiology clinic with any interval questions or concerns.  Thank you for this interesting consult.  I greatly enjoyed meeting DEARIS DANIS and look forward to participating in their care.  A copy of this report was sent to the requesting provider on this date.  Electronically Signed: Sandi Mariscal 03/24/2018, 4:09 PM   I spent a total of 25 Minutes in face to face in clinical consultation, greater than 50% of which was counseling/coordinating care for left sided renal lesion.

## 2018-06-16 ENCOUNTER — Ambulatory Visit: Payer: Self-pay | Admitting: Student in an Organized Health Care Education/Training Program

## 2018-06-22 NOTE — Progress Notes (Addendum)
06/24/2018  10:45 AM   Mora Appl [birth year] [id number]  Referring provider: [facility], [facility] [place], [place] [id number]  Chief Complaint  Patient presents with  . Erectile Dysfunction    HPI: Jeremy Conley is a 48 y.o. male with a left renal mass and depression who presents today for erectile dysfunction and low libido.  He also has a personal history of known renal mass for which he is been previously seen and evaluated.  Patient was previously seen at this clinic for a left renal mass, originally seen on a CT scan at [facility].  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  He also has two other Bosniak 2 renal cysts on the left, which are non-worrisome.  Patient was referred to Interventional Radiology on 08/26/2017.  Patient will have an MRI in 08/2018, and it will be monitored at Interventional Radiology.  It appears that percutaneous intervention is planned, however the patient was counseled to lose weight which he is actively accomplishing in order to facilitate percutaneous intervention.  Patient's risk factors for erectile dysfunction include morbid obesity, hyperlipidemia, hypertension, and chronic narcotic use, amongst others.  Patient says that his libido is intact, but he is experiencing erectile dysfunction.  Patient reports spontaneous erections in the morning, and he can have sex with them, but he does not get them otherwise.  Patient says he has not yet started taking the narcotics prescribed for his knee.  Symptoms started about a year ago, but has gotten worse over time.  Patient has occasionally has nocturnal erections.  Patient is able to masturbate.  Patient has not tried Viagra.  Patient has seen a cardiologist at [facility] but no problems were found.  PMH: Past Medical History:  Diagnosis Date  . Arthritis   .  Arthritis   . Depression   . GERD (gastroesophageal reflux disease)   . Hyperlipidemia   . Hypertension   . Kidney mass     Surgical History: Past Surgical History:  Procedure Laterality Date  . IR RADIOLOGIST EVAL & MGMT  11/19/2017  . IR RADIOLOGIST EVAL & MGMT  03/24/2018  . KNEE ARTHROSCOPY Left 1993 and 1997    Home Medications:  Allergies as of 06/24/2018   No Known Allergies     Medication List       Accurate as of June 24, 2018 11:59 PM. Always use your most recent med list.        aspirin EC 81 MG tablet Take 81 mg by mouth daily.   atorvastatin 20 MG tablet Commonly known as:  LIPITOR Take 20 mg by mouth.   clindamycin 1 % external solution Commonly known as:  CLEOCIN T Apply topically to scalp and face daily.   ergocalciferol 1.25 MG (50000 UT) capsule Commonly known as:  VITAMIN D2 Take 50,000 Units by mouth once a week.   hydrochlorothiazide 25 MG tablet Commonly known as:  HYDRODIURIL Take 25 mg by mouth daily.   HYDROcodone-acetaminophen 7.5-325 MG tablet Commonly known as:  NORCO Take 1 tablet by mouth daily as needed for up to 30 days for moderate pain.   losartan 100 MG tablet Commonly known as:  COZAAR Take 100 mg by mouth daily.   meloxicam 7.5 MG tablet Commonly known as:  MOBIC Take 7.5 mg by mouth.   Omeprazole 20  MG Tbec Take by mouth.   sildenafil 20 MG tablet Commonly known as:  REVATIO Take 1 tablet (20 mg total) by mouth as needed. Take 1-5 tabs as needed prior to intercourse   triamcinolone cream 0.1 % Commonly known as:  KENALOG Apply to rash on arm 1-2 times daily as needed.       Allergies: No Known Allergies  Family History: Family History  Problem Relation Age of Onset  . Heart disease Father   . Bladder Cancer Neg Hx   . Kidney cancer Neg Hx   . Prostate cancer Neg Hx     Social History:  reports that he has been smoking cigars. He has smoked for the past 15.00 years. He has never used smokeless  tobacco. He reports current alcohol use. He reports that he does not use drugs.  ROS: UROLOGY Frequent Urination?: No Hard to postpone urination?: No Burning/pain with urination?: No Get up at night to urinate?: No Leakage of urine?: No Urine stream starts and stops?: No Trouble starting stream?: No Do you have to strain to urinate?: No Blood in urine?: No Urinary tract infection?: No Sexually transmitted disease?: No Injury to kidneys or bladder?: No Painful intercourse?: No Weak stream?: No Erection problems?: Yes Penile pain?: No  Gastrointestinal Nausea?: No Vomiting?: No Indigestion/heartburn?: No Diarrhea?: No Constipation?: No  Constitutional Fever: No Night sweats?: No Weight loss?: No Fatigue?: No  Skin Skin rash/lesions?: No Itching?: No  Eyes Blurred vision?: No Double vision?: No  Ears/Nose/Throat Sore throat?: No Sinus problems?: No  Hematologic/Lymphatic Swollen glands?: No Easy bruising?: No  Cardiovascular Leg swelling?: No Chest pain?: No  Respiratory Cough?: No Shortness of breath?: No  Endocrine Excessive thirst?: No  Musculoskeletal Back pain?: No Joint pain?: Yes  Neurological Headaches?: No Dizziness?: No  Psychologic Depression?: No Anxiety?: No  Physical Exam: Vital signs reviewed Constitutional:  Well nourished. Alert and oriented, No acute distress.   Cardiovascular: No clubbing, cyanosis, or edema. Respiratory: Normal respiratory effort, no increased work of breathing. Abdomen: Morbidly obese Skin: No rashes, bruises or suspicious lesions. Neurologic: Grossly intact, no focal deficits, moving all 4 extremities.  Slow ambulation secondary to obesity. Psychiatric: Normal mood and affect.  Laboratory Data:  Lab Results  Component Value Date   CREATININE 1.15 03/24/2018   Assessment & Plan:    1. Erectile Dysfunction - Discussed with patient possible causes of his erectile dysfunction which most  certainly is related to his comorbid conditions -The patient has no issues with his libido, we will not pursue hypogonadism work-up as in the setting of morbid obesity as I anticipate he will likely be hypogonadal which is not his primary issue -Given that he has had extensive cardiac evaluation is followed by cardiologist without any obvious cardiac issues, we will go ahead and trial PD 5 inhibitors - Patient will try Sildenafil 20 mg, as many tablets as needed (up to 5) prior to intercourse; he is warned not to take medications that contain nitrates.  I also advised him of the side effects, such as: headache, flushing, dyspepsia, abnormal vision, nasal congestion, back pain, myalgia, nausea, dizziness, and rash.  2. Left Renal Mass - Is now being followed every six months by Interventional Radiology -Plan for percutaneous intervention once the patient has had adequate weight loss per IR rec  Return for As needed.  [facility] [address], [place], [place] [address] [phone]  I, Adele Schilder, am acting as a Education administrator for [employer]  Erlene Quan, MD.    I have reviewed the above documentation for accuracy and completeness, and I agree with the above.   [name], MD

## 2018-06-23 ENCOUNTER — Ambulatory Visit
Payer: Medicaid Other | Attending: Student in an Organized Health Care Education/Training Program | Admitting: Student in an Organized Health Care Education/Training Program

## 2018-06-23 ENCOUNTER — Encounter: Payer: Self-pay | Admitting: Student in an Organized Health Care Education/Training Program

## 2018-06-23 VITALS — BP 135/94 | HR 81 | Temp 98.2°F | Resp 20 | Ht 72.0 in | Wt 345.0 lb

## 2018-06-23 DIAGNOSIS — G894 Chronic pain syndrome: Secondary | ICD-10-CM | POA: Insufficient documentation

## 2018-06-23 DIAGNOSIS — M25562 Pain in left knee: Secondary | ICD-10-CM | POA: Diagnosis present

## 2018-06-23 DIAGNOSIS — M25561 Pain in right knee: Secondary | ICD-10-CM | POA: Insufficient documentation

## 2018-06-23 DIAGNOSIS — M1711 Unilateral primary osteoarthritis, right knee: Secondary | ICD-10-CM | POA: Diagnosis not present

## 2018-06-23 DIAGNOSIS — M1712 Unilateral primary osteoarthritis, left knee: Secondary | ICD-10-CM | POA: Diagnosis present

## 2018-06-23 DIAGNOSIS — G8929 Other chronic pain: Secondary | ICD-10-CM

## 2018-06-23 MED ORDER — HYDROCODONE-ACETAMINOPHEN 7.5-325 MG PO TABS: 1.0000 | ORAL_TABLET | Freq: Every day | ORAL | 0 refills | Status: DC | PRN

## 2018-06-23 NOTE — Progress Notes (Signed)
Patient's Name: Jeremy Conley  MRN: [id number]  Referring Provider: [facility] [place] [facility]  DOB: 07/09/1970  PCP: [facility] [place] [facility]  DOS: 06/23/2018  Note by: Gillis Santa, MD  Service setting: Ambulatory outpatient  Specialty: Interventional Pain Management  Location: [facility] (AMB) Pain Management Facility    Patient type: Established   Primary Reason(s) for Visit: Encounter for evaluation before starting new chronic pain management plan of care (Level of risk: moderate) CC: Knee Pain (bilateral) and Ankle Pain (right)  HPI  Mr. Ndiaye is a 48 y.o. year old, male patient, who comes today for a follow-up evaluation to review the test results and decide on a treatment plan. He has Chronic pain syndrome; Morbid (severe) obesity due to excess calories ([place]); Bilateral primary osteoarthritis of knee; Essential hypertension; Dyspnea on effort; H/O arthroscopic knee surgery; Renal mass; Dissecting cellulitis of scalp; and Chronic pain of both knees on their problem list. His primarily concern today is the Knee Pain (bilateral) and Ankle Pain (right)  Pain Assessment: Location: Right, Left Knee Radiating: denies Onset: More than a month ago Duration: Chronic pain Quality: Shooting Severity: 7 /10 (subjective, self-reported pain score)  Note: Reported level is inconsistent with clinical observations.                         When using our objective Pain Scale, levels between 6 and 10/10 are said to belong in an emergency room, as it progressively worsens from a 6/10, described as severely limiting, requiring emergency care not usually available at an outpatient pain management facility. At a 6/10 level, communication becomes difficult and requires great effort. Assistance to reach the emergency department may be required. Facial flushing and profuse sweating along with potentially dangerous increases in heart rate and blood pressure will be evident. Effect on ADL: Limits activities Timing:  Intermittent Modifying factors: denies BP: (!) 135/94  HR: 81  Mr. Mayden comes in today for a follow-up visit after his initial evaluation on Visit date not found. Today we went over the results of his tests. These were explained in "Layman's terms". During today's appointment we went over my diagnostic impression, as well as the proposed treatment plan.  In considering the treatment plan options, Mr. Walle was reminded that I no longer take patients for medication management only. I asked him to let me know if he had no intention of taking advantage of the interventional therapies, so that we could make arrangements to provide this space to someone interested. I also made it clear that undergoing interventional therapies for the purpose of getting pain medications is very inappropriate on the part of a patient, and it will not be tolerated in this practice. This type of behavior would suggest true addiction and therefore it requires referral to an addiction specialist.   Further details on both, my assessment(s), as well as the proposed treatment plan, please see below.  Controlled Substance Pharmacotherapy Assessment REMS (Risk Evaluation and Mitigation Strategy)   Pill Count: None expected due to no prior prescriptions written by our practice. Dewayne Shorter, RN  06/23/2018  8:36 AM  Signed Safety precautions to be maintained throughout the outpatient stay will include: orient to surroundings, keep bed in low position, maintain call bell within reach at all times, provide assistance with transfer out of bed and ambulation.   Pharmacokinetics: Liberation and absorption (onset of action): WNL Distribution (time to peak effect): WNL Metabolism and excretion (duration of action): WNL  Pharmacodynamics: Desired effects: Analgesia: Mr. Bramhall reports >50% benefit. Functional ability: Patient reports that medication allows him to accomplish basic ADLs Clinically meaningful improvement in  function (CMIF): Sustained CMIF goals met Perceived effectiveness: Described as relatively effective, allowing for increase in activities of daily living (ADL) Undesirable effects: Side-effects or Adverse reactions: None reported Monitoring: [place] PMP: Online review of the past 55-monthperiod previously conducted. Not applicable at this point since we have not taken over the patient's medication management yet. List of other Serum/Urine Drug Screening Test(s):  No results found for: AMPHSCRSER, BARBSCRSER, BENZOSCRSER, COCAINSCRSER, COCAINSCRNUR, PCPSCRSER, THCSCRSER, THCU, CANNABQUANT, [place] EBoydsList of all UDS test(s) done:  Lab Results  Component Value Date   SUMMARY FINAL 12/10/2016   Last UDS on record: Summary  Date Value Ref Range Status  12/10/2016 FINAL  Final    Comment:    ==================================================================== TOXASSURE COMP DRUG ANALYSIS,UR ==================================================================== Test                             Result       Flag       Units Drug Absent but Declared for Prescription Verification   Hydrocodone                    Not Detected UNEXPECTED ng/mg creat   Gabapentin                     Not Detected UNEXPECTED   Acetaminophen                  Not Detected UNEXPECTED    Acetaminophen, as indicated in the declared medication list, is    not always detected even when used as directed. ==================================================================== Test                      Result    Flag   Units      Ref Range   Creatinine              343              mg/dL      >=20 ==================================================================== Declared Medications:  The flagging and interpretation on this report are based on the  following declared medications.  Unexpected results may arise from  inaccuracies in the declared medications.  **Note: The testing scope of this  panel includes these medications:  Gabapentin (Neurontin)  Hydrocodone (Norco)  Hydrocodone (Vicodin)  **Note: The testing scope of this panel does not include small to  moderate amounts of these reported medications:  Acetaminophen (Norco)  Acetaminophen (Vicodin)  **Note: The testing scope of this panel does not include following  reported medications:  Hydrochlorothiazide (Hydrodiuril)  Losartan (Cozaar)  Meloxicam (Mobic) ==================================================================== For clinical consultation, please call [phone] ====================================================================    UDS interpretation: No unexpected findings.          Medication Assessment Form: Patient introduced to form today Treatment compliance: Treatment may start today if patient agrees with proposed plan. Evaluation of compliance is not applicable at this point Risk Assessment Profile: Aberrant behavior: See initial evaluations. None observed or detected today Comorbid factors increasing risk of overdose: See initial evaluation. No additional risks detected today Opioid risk tool (ORT) (Total Score):   Personal History of Substance Abuse (SUD-Substance use disorder):  Alcohol:    Illegal Drugs:    Rx Drugs:    ORT Risk Level  calculation:   Risk of substance use disorder (SUD): Low  ORT Scoring interpretation table:  Score <3 = Low Risk for SUD  Score between 4-7 = Moderate Risk for SUD  Score >8 = High Risk for Opioid Abuse   Risk Mitigation Strategies:  Patient opioid safety counseling: Completed today. Counseling provided to patient as per "Patient Counseling Document". Document signed by patient, attesting to counseling and understanding Patient-Prescriber Agreement (PPA): Obtained today.  Controlled substance notification to other providers: Written and sent today.  Pharmacologic Plan: Today we may be taking over the patient's pharmacological regimen. See below.              Laboratory Chemistry  Inflammation Markers (CRP: Acute Phase) (ESR: Chronic Phase) No results found for: CRP, ESRSEDRATE, LATICACIDVEN                       Rheumatology Markers No results found for: RF, ANA, LABURIC, URICUR, LYMEIGGIGMAB, LYMEABIGMQN, HLAB27                      Renal Function Markers Lab Results  Component Value Date   BUN 17 04/08/2017   CREATININE 1.15 03/24/2018   GFRAA >60 03/24/2018   GFRNONAA >60 03/24/2018                             Hepatic Function Markers No results found for: AST, ALT, ALBUMIN, ALKPHOS, HCVAB, AMYLASE, LIPASE, AMMONIA                      Electrolytes Lab Results  Component Value Date   NA 130 (L) 04/08/2017   K 3.5 04/08/2017   CL 96 (L) 04/08/2017   CALCIUM 9.2 04/08/2017                        Neuropathy Markers No results found for: VITAMINB12, FOLATE, HGBA1C, HIV                      CNS Tests No results found for: COLORCSF, APPEARCSF, RBCCOUNTCSF, WBCCSF, POLYSCSF, LYMPHSCSF, EOSCSF, PROTEINCSF, GLUCCSF, JCVIRUS, CSFOLI, IGGCSF                      Bone Pathology Markers No results found for: [place], [id number], TR3202BX4, DH6861UO3, 25OHVITD1, 25OHVITD2, 25OHVITD3, TESTOFREE, TESTOSTERONE                       Coagulation Parameters Lab Results  Component Value Date   PLT 272 04/08/2017                        Cardiovascular Markers Lab Results  Component Value Date   TROPONINI <0.03 04/08/2017   HGB 14.2 04/08/2017   HCT 42.2 04/08/2017                         CA Markers No results found for: CEA, CA125, LABCA2                      Endocrine Markers No results found for: TSH, FREET4, TESTOFREE, TESTOSTERONE, ESTRADIOL, ESTRADIOLPCT, ESTRADIOLFRE                      Note: Lab results reviewed.  Recent Diagnostic Imaging Review  Radiology: Xrays of the left knee were ordered and interpreted 11/14/2016, with 3  views using AP, lateral, patella views.Xrays revealed the left knee with   medial compartment complete collapse and right knee medial compartment  narrowing but no bone-on-bone pathology.There is medial femoral condyle  and medial tibial plateau bilateral osteophyte formation.The left knee  has the screws from the previous ACL surgery.There is significant  patellofemoral degenerative changes involving the left knee more than the  right.  Meds   Current Outpatient Medications:  .  aspirin EC 81 MG tablet, Take 81 mg by mouth daily., Disp: , Rfl:  .  atorvastatin (LIPITOR) 20 MG tablet, Take 20 mg by mouth., Disp: , Rfl:  .  clindamycin (CLEOCIN T) 1 % external solution, Apply topically to scalp and face daily., Disp: , Rfl:  .  ergocalciferol (VITAMIN D2) 50000 units capsule, Take 50,000 Units by mouth once a week., Disp: , Rfl:  .  hydrochlorothiazide (HYDRODIURIL) 25 MG tablet, Take 25 mg by mouth daily., Disp: , Rfl:  .  losartan (COZAAR) 100 MG tablet, Take 100 mg by mouth daily., Disp: , Rfl:  .  meloxicam (MOBIC) 7.5 MG tablet, Take 7.5 mg by mouth., Disp: , Rfl:  .  Omeprazole 20 MG TBEC, Take by mouth., Disp: , Rfl:  .  triamcinolone cream (KENALOG) 0.1 %, Apply to rash on arm 1-2 times daily as needed., Disp: , Rfl:  .  HYDROcodone-acetaminophen (NORCO) 7.5-325 MG tablet, Take 1 tablet by mouth daily as needed for up to 30 days for moderate pain., Disp: 30 tablet, Rfl: 0  ROS  Constitutional: Denies any fever or chills Gastrointestinal: No reported hemesis, hematochezia, vomiting, or acute GI distress Musculoskeletal: Denies any acute onset joint swelling, redness, loss of ROM, or weakness Neurological: No reported episodes of acute onset apraxia, aphasia, dysarthria, agnosia, amnesia, paralysis, loss of coordination, or loss of consciousness  Allergies  Mr. Montilla has No Known Allergies.  PFSH  Drug: Mr. Besecker  reports no history of drug use. Alcohol:  reports current alcohol use. Tobacco:  reports that he has been smoking. He has never  used smokeless tobacco. Medical:  has a past medical history of Arthritis, Arthritis, Depression, GERD (gastroesophageal reflux disease), Hyperlipidemia, Hypertension, and Kidney mass. Surgical: Mr. Baird  has a past surgical history that includes Knee arthroscopy (Left, 1993 and 1997); IR Radiologist Eval & Mgmt (11/19/2017); and IR Radiologist Eval & Mgmt (03/24/2018). Family: family history includes Heart disease in his father.  Constitutional Exam  General appearance: Well nourished, well developed, and well hydrated. In no apparent acute distress Vitals:   06/23/18 0829  BP: (!) 135/94  Pulse: 81  Resp: 20  Temp: 98.2 F (36.8 C)  SpO2: 100%  Weight: (!) 345 lb (156.5 kg)  Height: 6' (1.829 m)   BMI Assessment: Estimated body mass index is 46.79 kg/m as calculated from the following:   Height as of this encounter: 6' (1.829 m).   Weight as of this encounter: 345 lb (156.5 kg).  BMI interpretation table: BMI level Category Range association with higher incidence of chronic pain  <18 kg/m2 Underweight   18.5-24.9 kg/m2 Ideal body weight   25-29.9 kg/m2 Overweight Increased incidence by 20%  30-34.9 kg/m2 Obese (Class I) Increased incidence by 68%  35-39.9 kg/m2 Severe obesity (Class II) Increased incidence by 136%  >40 kg/m2 Extreme obesity (Class III) Increased incidence by 254%   Patient's current BMI Ideal Body weight  Body mass index is 46.79 kg/m.  Ideal body weight: 77.6 kg (171 lb 1.2 oz) Adjusted ideal body weight: 109.2 kg (240 lb 10.3 oz)   BMI Readings from Last 4 Encounters:  06/23/18 46.79 kg/m  03/24/18 46.11 kg/m  11/19/17 46.11 kg/m  09/11/17 49.44 kg/m   Wt Readings from Last 4 Encounters:  06/23/18 (!) 345 lb (156.5 kg)  11/19/17 (!) 340 lb (154.2 kg)  09/11/17 (!) 364 lb 8 oz (165.3 kg)  08/26/17 (!) 373 lb (169.2 kg)  Psych/Mental status: Alert, oriented x 3 (person, place, & time)       Eyes: PERLA Respiratory: No evidence of acute  respiratory distress  Cervical Spine Area Exam  Skin & Axial Inspection: No masses, redness, edema, swelling, or associated skin lesions Alignment: Symmetrical Functional ROM: Unrestricted ROM      Stability: No instability detected Muscle Tone/Strength: Functionally intact. No obvious neuro-muscular anomalies detected. Sensory (Neurological): Unimpaired Palpation: No palpable anomalies              Upper Extremity (UE) Exam    Side: Right upper extremity  Side: Left upper extremity  Skin & Extremity Inspection: Skin color, temperature, and hair growth are WNL. No peripheral edema or cyanosis. No masses, redness, swelling, asymmetry, or associated skin lesions. No contractures.  Skin & Extremity Inspection: Skin color, temperature, and hair growth are WNL. No peripheral edema or cyanosis. No masses, redness, swelling, asymmetry, or associated skin lesions. No contractures.  Functional ROM: Unrestricted ROM          Functional ROM: Unrestricted ROM          Muscle Tone/Strength: Functionally intact. No obvious neuro-muscular anomalies detected.  Muscle Tone/Strength: Functionally intact. No obvious neuro-muscular anomalies detected.  Sensory (Neurological): Unimpaired          Sensory (Neurological): Unimpaired          Palpation: No palpable anomalies              Palpation: No palpable anomalies              Provocative Test(s):  Phalen's test: deferred Tinel's test: deferred Apley's scratch test (touch opposite shoulder):  Action 1 (Across chest): deferred Action 2 (Overhead): deferred Action 3 (LB reach): deferred   Provocative Test(s):  Phalen's test: deferred Tinel's test: deferred Apley's scratch test (touch opposite shoulder):  Action 1 (Across chest): deferred Action 2 (Overhead): deferred Action 3 (LB reach): deferred    Thoracic Spine Area Exam  Skin & Axial Inspection: No masses, redness, or swelling Alignment: Symmetrical Functional ROM: Unrestricted ROM Stability:  No instability detected Muscle Tone/Strength: Functionally intact. No obvious neuro-muscular anomalies detected. Sensory (Neurological): Unimpaired Muscle strength & Tone: No palpable anomalies  Lumbar Spine Area Exam  Skin & Axial Inspection: No masses, redness, or swelling Alignment: Symmetrical Functional ROM: Decreased ROM       Stability: No instability detected Muscle Tone/Strength: Functionally intact. No obvious neuro-muscular anomalies detected. Sensory (Neurological): Unimpaired Palpation: No palpable anomalies       Provocative Tests: Hyperextension/rotation test: deferred today       Lumbar quadrant test (Kemp's test): deferred today       Lateral bending test: deferred today       Patrick's Maneuver: deferred today                   FABER* test: deferred today                   S-I anterior distraction/compression test: deferred today  S-I lateral compression test: deferred today         S-I Thigh-thrust test: deferred today         S-I Gaenslen's test: deferred today         *(Flexion, ABduction and External Rotation)  Gait & Posture Assessment  Ambulation: Patient ambulates using a cane Gait: Limited. Using assistive device to ambulate Posture: Difficulty standing up straight, due to pain   Lower Extremity Exam    Side: Right lower extremity  Side: Left lower extremity  Stability: No instability observed          Stability: No instability observed          Skin & Extremity Inspection: Skin color, temperature, and hair growth are WNL. No peripheral edema or cyanosis. No masses, redness, swelling, asymmetry, or associated skin lesions. No contractures.  Skin & Extremity Inspection: Skin color, temperature, and hair growth are WNL. No peripheral edema or cyanosis. No masses, redness, swelling, asymmetry, or associated skin lesions. No contractures.  Functional ROM: Pain restricted ROM for knee joint          Functional ROM: Pain restricted ROM for knee joint           Muscle Tone/Strength: Functionally intact. No obvious neuro-muscular anomalies detected.  Muscle Tone/Strength: Functionally intact. No obvious neuro-muscular anomalies detected.  Sensory (Neurological): Arthropathic arthralgia        Sensory (Neurological): Arthropathic arthralgia        DTR: Patellar: 1+: trace Achilles: deferred today Plantar: deferred today  DTR: Patellar: 1+: trace Achilles: deferred today Plantar: deferred today  Palpation: No palpable anomalies  Palpation: No palpable anomalies   Assessment & Plan  Primary Diagnosis & Pertinent Problem List: The primary encounter diagnosis was Chronic pain syndrome. Diagnoses of Primary osteoarthritis of right knee, Primary osteoarthritis of left knee, Morbid obesity ([place]), and Chronic pain of both knees were also pertinent to this visit.  Visit Diagnosis: 1. Chronic pain syndrome   2. Primary osteoarthritis of right knee   3. Primary osteoarthritis of left knee   4. Morbid obesity ([place])   5. Chronic pain of both knees    Left greater than right knee pain.  Status post bilateral genicular nerve block as well as intra-articular knee steroid injections which were not helpful.  Patient is not interested in repeating genicular nerve block.  Knee replacement surgery has been recommended however patient does have a renal mass that is growing in size.  Orthopedic team wants to defer surgery until mass stops expanding.  Patient has surveillance imaging studies every 6 months to monitor renal mass size and growth.  Patient is being referred for chronic pain management.  Discussed low-dose opioid therapy as below.  Repeat UDS today.  Sign opioid contract.  Plan of Care  Pharmacotherapy (Medications Ordered): Meds ordered this encounter  Medications  . HYDROcodone-acetaminophen (NORCO) 7.5-325 MG tablet    Sig: Take 1 tablet by mouth daily as needed for up to 30 days for moderate pain.    Dispense:  30 tablet    Refill:  0    Do not  place this medication, or any other prescription from our practice, on "Automatic Refill". Patient may have prescription filled one day early if pharmacy is closed on scheduled refill date.   Time Note: Greater than 50% of the 25 minute(s) of face-to-face time spent with Mr. Todorov, was spent in counseling/coordination of care regarding: Mr. Volkert primary cause of pain, the treatment plan, medication side effects, the  opioid analgesic risks and possible complications, the appropriate use of his medications, realistic expectations, the goals of pain management (increased in functionality), the need to bring and keep the BMI below 30, the medication agreement and the patient's responsibilities when it comes to controlled substances.  Provider-requested follow-up: Return in about 4 weeks (around 07/21/2018) for Medication Management.  Future Appointments  Date Time Provider [place]  06/24/2018  2:15 PM [name], MD [facility] None  07/16/2018 10:45 AM Gillis Santa, MD [facility] None    Primary Care Physician: [facility] [place] [facility] Location: [facility] Note by: Gillis Santa, M.D Date: 06/23/2018; Time: 9:30 AM  Patient Instructions  1. Opioid contract 2. Rx e-prescribed  Hydrocodone with acetaminophen to last until 07/23/2018 has been escribed to your pharmacy.

## 2018-06-23 NOTE — Patient Instructions (Addendum)
1. Opioid contract 2. Rx e-prescribed  Hydrocodone with acetaminophen to last until 07/23/2018 has been escribed to your pharmacy.

## 2018-06-23 NOTE — Progress Notes (Signed)
Safety precautions to be maintained throughout the outpatient stay will include: orient to surroundings, keep bed in low position, maintain call bell within reach at all times, provide assistance with transfer out of bed and ambulation.  

## 2018-06-24 ENCOUNTER — Encounter: Payer: Self-pay | Admitting: Urology

## 2018-06-24 ENCOUNTER — Ambulatory Visit (INDEPENDENT_AMBULATORY_CARE_PROVIDER_SITE_OTHER): Payer: Medicaid Other | Admitting: Urology

## 2018-06-24 DIAGNOSIS — N2889 Other specified disorders of kidney and ureter: Secondary | ICD-10-CM

## 2018-06-24 DIAGNOSIS — N5203 Combined arterial insufficiency and corporo-venous occlusive erectile dysfunction: Secondary | ICD-10-CM

## 2018-06-24 MED ORDER — SILDENAFIL CITRATE 20 MG PO TABS: 20.0000 mg | ORAL_TABLET | ORAL | 11 refills | Status: DC | PRN

## 2018-07-09 ENCOUNTER — Encounter: Payer: Self-pay | Admitting: Urology

## 2018-07-16 ENCOUNTER — Ambulatory Visit
Payer: Medicaid Other | Attending: Student in an Organized Health Care Education/Training Program | Admitting: Student in an Organized Health Care Education/Training Program

## 2018-07-16 ENCOUNTER — Other Ambulatory Visit: Payer: Self-pay

## 2018-07-16 ENCOUNTER — Encounter: Payer: Self-pay | Admitting: Student in an Organized Health Care Education/Training Program

## 2018-07-16 VITALS — BP 141/97 | HR 91 | Temp 98.6°F | Resp 18 | Ht 72.0 in | Wt 345.0 lb

## 2018-07-16 DIAGNOSIS — G894 Chronic pain syndrome: Secondary | ICD-10-CM | POA: Diagnosis present

## 2018-07-16 DIAGNOSIS — M1712 Unilateral primary osteoarthritis, left knee: Secondary | ICD-10-CM

## 2018-07-16 DIAGNOSIS — M25562 Pain in left knee: Secondary | ICD-10-CM

## 2018-07-16 DIAGNOSIS — M25561 Pain in right knee: Secondary | ICD-10-CM | POA: Insufficient documentation

## 2018-07-16 DIAGNOSIS — I10 Essential (primary) hypertension: Secondary | ICD-10-CM | POA: Diagnosis present

## 2018-07-16 DIAGNOSIS — G8929 Other chronic pain: Secondary | ICD-10-CM

## 2018-07-16 DIAGNOSIS — Z9889 Other specified postprocedural states: Secondary | ICD-10-CM | POA: Diagnosis present

## 2018-07-16 DIAGNOSIS — M1711 Unilateral primary osteoarthritis, right knee: Secondary | ICD-10-CM

## 2018-07-16 MED ORDER — HYDROCODONE-ACETAMINOPHEN 7.5-325 MG PO TABS: 1.0000 | ORAL_TABLET | Freq: Every day | ORAL | 0 refills | Status: DC | PRN

## 2018-07-16 NOTE — Progress Notes (Signed)
Patient's Name: Jeremy Conley  MRN: [id number]  Referring Provider: [facility]  DOB: Oct 09, 1970  PCP: [facility]  DOS: 07/16/2018  Note by: Gillis Santa, MD  Service setting: Ambulatory outpatient  Specialty: Interventional Pain Management  Location: [facility] (AMB) Pain Management Facility    Patient type: Established   Primary Reason(s) for Visit: Encounter for prescription drug management. (Level of risk: moderate)  CC: Knee Pain (bilat) and Ankle Pain (right)  HPI  Jeremy Conley is a 48 y.o. year old, male patient, who comes today for a medication management evaluation. He has Chronic pain syndrome; Morbid obesity ([place]); Bilateral primary osteoarthritis of knee; Essential hypertension; Dyspnea on effort; H/O arthroscopic knee surgery; Renal mass; Dissecting cellulitis of scalp; Chronic pain of both knees; Primary osteoarthritis of right knee; and Primary osteoarthritis of left knee on their problem list. His primarily concern today is the Knee Pain (bilat) and Ankle Pain (right)  Pain Assessment: Location: Right, Left Knee Radiating: denies Onset: More than a month ago Duration: Chronic pain Quality: Shooting Severity: 7 /10 (subjective, self-reported pain score)  Note: Reported level is inconsistent with clinical observations.                         When using our objective Pain Scale, levels between 6 and 10/10 are said to belong in an emergency room, as it progressively worsens from a 6/10, described as severely limiting, requiring emergency care not usually available at an outpatient pain management facility. At a 6/10 level, communication becomes difficult and requires great effort. Assistance to reach the emergency department may be required. Facial flushing and profuse sweating along with potentially dangerous increases in heart rate and blood pressure will be evident. Effect on ADL: limits activities Timing: Constant Modifying factors: meds BP: (!) 141/97  HR:  91  Jeremy Conley was last scheduled for an appointment on 06/23/2018 for medication management. During today's appointment we reviewed Jeremy Conley's chronic pain status, as well as his outpatient medication regimen.  The patient  reports no history of drug use. His body mass index is 46.79 kg/m.  Further details on both, my assessment(s), as well as the proposed treatment plan, please see below.  Controlled Substance Pharmacotherapy Assessment REMS (Risk Evaluation and Mitigation Strategy)   06/23/2018  1   06/23/2018  Hydrocodone-Acetamin 7.5-325  30.00 30 Bi Lat   [id number]   [place] (1123)   0  7.50 MME  Medicaid   [place]    Rise Patience, RN  07/16/2018 10:36 AM  Signed Nursing Pain Medication Assessment:  Safety precautions to be maintained throughout the outpatient stay will include: orient to surroundings, keep bed in low position, maintain call bell within reach at all times, provide assistance with transfer out of bed and ambulation.  Medication Inspection Compliance: Jeremy Conley did not comply with our request to bring his pills to be counted. He was reminded that bringing the medication bottles, even when empty, is a requirement.  Medication: None brought in. Pill/Patch Count: None available to be counted. Bottle Appearance: No container available. Did not bring bottle(s) to appointment. Filled Date: N/A Last Medication intake:  Today   Pharmacokinetics: Liberation and absorption (onset of action): WNL Distribution (time to peak effect): WNL Metabolism and excretion (duration of action): WNL         Pharmacodynamics: Desired effects: Analgesia: Jeremy Conley reports >50% benefit. Functional ability: Patient reports that medication allows him to accomplish basic ADLs Clinically meaningful improvement  in function (CMIF): Sustained CMIF goals met Perceived effectiveness: Described as relatively effective, allowing for increase in activities of daily living (ADL) Undesirable  effects: Side-effects or Adverse reactions: None reported Monitoring: [place] PMP: Online review of the past 12-month period conducted. Compliant with practice rules and regulations Last UDS on record: Summary  Date Value Ref Range Status  12/10/2016 FINAL  Final    Comment:    ==================================================================== TOXASSURE COMP DRUG ANALYSIS,UR ==================================================================== Test                             Result       Flag       Units Drug Absent but Declared for Prescription Verification   Hydrocodone                    Not Detected UNEXPECTED ng/mg creat   Gabapentin                     Not Detected UNEXPECTED   Acetaminophen                  Not Detected UNEXPECTED    Acetaminophen, as indicated in the declared medication list, is    not always detected even when used as directed. ==================================================================== Test                      Result    Flag   Units      Ref Range   Creatinine              343              mg/dL      >=20 ==================================================================== Declared Medications:  The flagging and interpretation on this report are based on the  following declared medications.  Unexpected results may arise from  inaccuracies in the declared medications.  **Note: The testing scope of this panel includes these medications:  Gabapentin (Neurontin)  Hydrocodone (Norco)  Hydrocodone (Vicodin)  **Note: The testing scope of this panel does not include small to  moderate amounts of these reported medications:  Acetaminophen (Norco)  Acetaminophen (Vicodin)  **Note: The testing scope of this panel does not include following  reported medications:  Hydrochlorothiazide (Hydrodiuril)  Losartan (Cozaar)  Meloxicam (Mobic) ==================================================================== For clinical consultation, please call [phone]. ====================================================================    UDS interpretation: Compliant          Medication Assessment Form: Reviewed. Patient indicates being compliant with therapy Treatment compliance: Compliant Risk Assessment Profile: Aberrant behavior: See initial evaluations. None observed or detected today Comorbid factors increasing risk of overdose: See initial evaluation. No additional risks detected today Opioid risk tool (ORT):  Opioid Risk  07/16/2018  Alcohol 0  Illegal Drugs 0  Rx Drugs 0  Alcohol 0  Illegal Drugs 0  Rx Drugs 0  Age between 16-45 years  0  History of Preadolescent Sexual Abuse 0  Psychological Disease 0  Depression 0  Opioid Risk Tool Scoring 0  Opioid Risk Interpretation Low Risk    ORT Scoring interpretation table:  Score <3 = Low Risk for SUD  Score between 4-7 = Moderate Risk for SUD  Score >8 = High Risk for Opioid Abuse   Risk of substance use disorder (SUD): Low  Risk Mitigation Strategies:  Patient Counseling: Covered Patient-Prescriber Agreement (PPA): Present and active  Notification to other healthcare providers: Done  Pharmacologic Plan: No change   in therapy, at this time.             Laboratory Chemistry  Inflammation Markers (CRP: Acute Phase) (ESR: Chronic Phase) No results found for: CRP, ESRSEDRATE, LATICACIDVEN                       Rheumatology Markers No results found for: RF, ANA, LABURIC, URICUR, LYMEIGGIGMAB, LYMEABIGMQN, HLAB27                      Renal Function Markers Lab Results  Component Value Date   BUN 17 04/08/2017   CREATININE 1.15 03/24/2018   GFRAA >60 03/24/2018   GFRNONAA >60 03/24/2018                             Hepatic Function Markers No results found for: AST, ALT, ALBUMIN, ALKPHOS, HCVAB, AMYLASE, LIPASE, AMMONIA                      Electrolytes Lab Results  Component Value Date   NA 130 (L) 04/08/2017   K 3.5 04/08/2017   CL 96 (L) 04/08/2017   CALCIUM  9.2 04/08/2017                        Neuropathy Markers No results found for: VITAMINB12, FOLATE, HGBA1C, HIV                      CNS Tests No results found for: COLORCSF, APPEARCSF, RBCCOUNTCSF, WBCCSF, POLYSCSF, LYMPHSCSF, EOSCSF, PROTEINCSF, GLUCCSF, JCVIRUS, CSFOLI, IGGCSF                      Bone Pathology Markers No results found for: [place], [id number], [id number], [id number], 25OHVITD1, 25OHVITD2, 25OHVITD3, TESTOFREE, TESTOSTERONE                       Coagulation Parameters Lab Results  Component Value Date   PLT 272 04/08/2017                        Cardiovascular Markers Lab Results  Component Value Date   TROPONINI <0.03 04/08/2017   HGB 14.2 04/08/2017   HCT 42.2 04/08/2017                         CA Markers No results found for: CEA, CA125, LABCA2                      Endocrine Markers No results found for: TSH, FREET4, TESTOFREE, TESTOSTERONE, ESTRADIOL, ESTRADIOLPCT, ESTRADIOLFRE                      Note: Lab results reviewed.  Recent Diagnostic Imaging Results  IR Radiologist Eval & Mgmt Please refer to notes tab for details about interventional procedure. (Op  Note) [name] CONTRAST CLINICAL DATA:  Follow-up left renal mass.  EXAM: MRI ABDOMEN WITHOUT AND WITH CONTRAST  TECHNIQUE: Multiplanar multisequence MR imaging of the abdomen was performed both before and after the administration of intravenous contrast.  CONTRAST:  10 cc Gadavist IV.  COMPARISON:  08/21/2017 MRI abdomen.  FINDINGS: Postcontrast sequences are significantly motion degraded, limiting assessment.  Lower chest: No acute abnormality at the lung bases.  Hepatobiliary: Normal liver size and configuration. There  is marked diffuse hepatic steatosis on chemical shift imaging. No liver mass. Normal gallbladder with no cholelithiasis. No biliary ductal dilatation. Common bile duct diameter 4 mm. No choledocholithiasis.  Pancreas: No pancreatic mass or duct  dilation.  No pancreas divisum.  Spleen: Normal size. No mass.  Adrenals/Urinary Tract: Normal adrenals. No hydronephrosis. There is a 1.6 x 1.5 cm renal cortical mass in the posterior lower left kidney (series 7/image 19), which demonstrates heterogeneous T2 signal intensity and heterogeneous enhancement, increased from 1.0 x 0.9 cm on 08/21/2017 MRI. Simple small upper right renal cysts, largest 1.1 cm. Two similar small exophytic left renal cortical lesions measuring 1.6 cm (series 7/image 12) and 1.1 cm (series 7/image 15) are stable in size since 08/21/2017 MRI and demonstrate slight T1 hyperintensity and no convincing enhancement, compatible with Bosniak category 2 hemorrhagic/proteinaceous renal cysts.  Stomach/Bowel: Normal non-distended stomach. Visualized small and large bowel is normal caliber, with no bowel wall thickening.  Vascular/Lymphatic: Normal caliber abdominal aorta. Patent portal, splenic, hepatic and renal veins. No tumor thrombus in the renal veins. No pathologically enlarged lymph nodes in the abdomen.  Other: No abdominal ascites or focal fluid collection.  Musculoskeletal: No aggressive appearing focal osseous lesions.  IMPRESSION: 1. Mild interval growth of enhancing renal cortical mass in the posterior lower left kidney, now 1.6 x 1.5 cm, most compatible with renal cell carcinoma. 2. Patent renal veins with no tumor thrombus. No evidence of metastatic disease in the abdomen. 3. Small Bosniak category 1 and category 2 renal cysts. 4. Marked diffuse hepatic steatosis.  Electronically Signed   By: Jason A Poff M.D.   On: 03/24/2018 15:49  Complexity Note: Imaging results reviewed. Results shared with Mr. Critcher, using Layman's terms.                         Meds   Current Outpatient Medications:  .  aspirin EC 81 MG tablet, Take 81 mg by mouth daily., Disp: , Rfl:  .  atorvastatin (LIPITOR) 20 MG tablet, Take 20 mg by mouth., Disp: , Rfl:  .   clindamycin (CLEOCIN T) 1 % external solution, Apply topically to scalp and face daily., Disp: , Rfl:  .  ergocalciferol (VITAMIN D2) 50000 units capsule, Take 50,000 Units by mouth once a week., Disp: , Rfl:  .  hydrochlorothiazide (HYDRODIURIL) 25 MG tablet, Take 25 mg by mouth daily., Disp: , Rfl:  .  [START ON 07/22/2018] HYDROcodone-acetaminophen (NORCO) 7.5-325 MG tablet, Take 1 tablet by mouth daily as needed for up to 30 days for moderate pain., Disp: 30 tablet, Rfl: 0 .  losartan (COZAAR) 100 MG tablet, Take 100 mg by mouth daily., Disp: , Rfl:  .  meloxicam (MOBIC) 7.5 MG tablet, Take 7.5 mg by mouth., Disp: , Rfl:  .  Omeprazole 20 MG TBEC, Take by mouth., Disp: , Rfl:  .  sildenafil (REVATIO) 20 MG tablet, Take 1 tablet (20 mg total) by mouth as needed. Take 1-5 tabs as needed prior to intercourse, Disp: 30 tablet, Rfl: 11 .  triamcinolone cream (KENALOG) 0.1 %, Apply to rash on arm 1-2 times daily as needed., Disp: , Rfl:   ROS  Constitutional: Denies any fever or chills Gastrointestinal: No reported hemesis, hematochezia, vomiting, or acute GI distress Musculoskeletal: Denies any acute onset joint swelling, redness, loss of ROM, or weakness Neurological: No reported episodes of acute onset apraxia, aphasia, dysarthria, agnosia, amnesia, paralysis, loss of coordination, or loss of consciousness  Allergies    Mr. Parada has No Known Allergies.  PFSH  Drug: Mr. Mcgibbon  reports no history of drug use. Alcohol:  reports current alcohol use. Tobacco:  reports that he has been smoking cigars. He has smoked for the past 15.00 years. He has never used smokeless tobacco. Medical:  has a past medical history of Arthritis, Arthritis, Depression, GERD (gastroesophageal reflux disease), Hyperlipidemia, Hypertension, and Kidney mass. Surgical: Mr. Hermiz  has a past surgical history that includes Knee arthroscopy (Left, 1993 and 1997); IR Radiologist Eval & Mgmt (11/19/2017); and IR Radiologist Eval  & Mgmt (03/24/2018). Family: family history includes Heart disease in his father.  Constitutional Exam  General appearance: Well nourished, well developed, and well hydrated. In no apparent acute distress Vitals:   07/16/18 1040  BP: (!) 141/97  Pulse: 91  Resp: 18  Temp: 98.6 F (37 C)  TempSrc: Oral  SpO2: 98%  Weight: (!) 345 lb (156.5 kg)  Height: 6' (1.829 m)   BMI Assessment: Estimated body mass index is 46.79 kg/m as calculated from the following:   Height as of this encounter: 6' (1.829 m).   Weight as of this encounter: 345 lb (156.5 kg).  BMI interpretation table: BMI level Category Range association with higher incidence of chronic pain  <18 kg/m2 Underweight   18.5-24.9 kg/m2 Ideal body weight   25-29.9 kg/m2 Overweight Increased incidence by 20%  30-34.9 kg/m2 Obese (Class I) Increased incidence by 68%  35-39.9 kg/m2 Severe obesity (Class II) Increased incidence by 136%  >40 kg/m2 Extreme obesity (Class III) Increased incidence by 254%   Patient's current BMI Ideal Body weight  Body mass index is 46.79 kg/m. Ideal body weight: 77.6 kg (171 lb 1.2 oz) Adjusted ideal body weight: 109.2 kg (240 lb 10.3 oz)   BMI Readings from Last 4 Encounters:  07/16/18 46.79 kg/m  06/23/18 46.79 kg/m  03/24/18 46.11 kg/m  11/19/17 46.11 kg/m   Wt Readings from Last 4 Encounters:  07/16/18 (!) 345 lb (156.5 kg)  06/23/18 (!) 345 lb (156.5 kg)  11/19/17 (!) 340 lb (154.2 kg)  09/11/17 (!) 364 lb 8 oz (165.3 kg)  Psych/Mental status: Alert, oriented x 3 (person, place, & time)       Eyes: PERLA Respiratory: No evidence of acute respiratory distress   Lumbar Spine Area Exam  Skin & Axial Inspection: No masses, redness, or swelling Alignment: Symmetrical Functional ROM: Decreased ROM       Stability: No instability detected Muscle Tone/Strength: Functionally intact. No obvious neuro-muscular anomalies detected. Sensory (Neurological): Unimpaired Palpation: No  palpable anomalies       Provocative Tests: Hyperextension/rotation test: deferred today       Lumbar quadrant test (Kemp's test): deferred today       Lateral bending test: deferred today       Patrick's Maneuver: deferred today                   FABER* test: deferred today                   S-I anterior distraction/compression test: deferred today         S-I lateral compression test: deferred today         S-I Thigh-thrust test: deferred today         S-I Gaenslen's test: deferred today         *(Flexion, ABduction and External Rotation)  Gait & Posture Assessment  Ambulation: Patient ambulates using a cane Gait: Limited. Using  assistive device to ambulate Posture: Difficulty standing up straight, due to pain   Lower Extremity Exam    Side: Right lower extremity  Side: Left lower extremity  Stability: No instability observed          Stability: No instability observed          Skin & Extremity Inspection: Skin color, temperature, and hair growth are WNL. No peripheral edema or cyanosis. No masses, redness, swelling, asymmetry, or associated skin lesions. No contractures.  Skin & Extremity Inspection: Skin color, temperature, and hair growth are WNL. No peripheral edema or cyanosis. No masses, redness, swelling, asymmetry, or associated skin lesions. No contractures.  Functional ROM: Pain restricted ROM for knee joint          Functional ROM: Pain restricted ROM for knee joint          Muscle Tone/Strength: Functionally intact. No obvious neuro-muscular anomalies detected.  Muscle Tone/Strength: Functionally intact. No obvious neuro-muscular anomalies detected.  Sensory (Neurological): Arthropathic arthralgia        Sensory (Neurological): Arthropathic arthralgia        DTR: Patellar: 1+: trace Achilles: deferred today Plantar: deferred today  DTR: Patellar: 1+: trace Achilles: deferred today Plantar: deferred today  Palpation: No palpable anomalies  Palpation: No  palpable anomalies    Assessment   Status Diagnosis  Controlled Controlled Controlled 1. Chronic pain syndrome   2. Primary osteoarthritis of right knee   3. Primary osteoarthritis of left knee   4. Morbid obesity (H[facility])   5. Chronic pain of both knees   6. Essential hypertension   7. H/O arthroscopic knee surgery      Updated Problems: Problem  Primary Osteoarthritis of Right Knee  Primary Osteoarthritis of Left Knee  Morbid Obesity ([facility])   Overview:  per last BMI in vitals on 03/12/2017   General Recommendations: The pain condition that the patient suffers from is best treated with a multidisciplinary approach that involves an increase in physical activity to prevent de-conditioning and worsening of the pain cycle, as well as psychological counseling (formal and/or informal) to address the co-morbid psychological affects of pain. Treatment will often involve judicious use of pain medications and interventional procedures to decrease the pain, allowing the patient to participate in the physical activity that will ultimately produce long-lasting pain reductions. The goal of the multidisciplinary approach is to return the patient to a higher level of overall function and to restore their ability to perform activities of daily living.  47-year-old male with history of morbid obesity, chronic pain syndrome secondary to primary osteoarthritis of bilateral knees.  Patient is status post bilateral genicular nerve block as well as intra-articular knee steroid injections done elsewhere which were not helpful.  He is not interested in repeating these at [facility].Knee replacement surgery has been recommended however patient does have a renal mass that is growing in size.  Orthopedic team wants to defer surgery until mass stops expanding.  Patient has surveillance imaging studies every 6 months to monitor renal mass size and growth.  Next surveillance imaging is in April.  Patient presents today for  medication management.  Patient forgot to bring his pill bottles in today.  I reminded the patient of our clinic policy.  Instructed the patient to return to clinic with pill bottles at his convenience to be checked at which point his new prescription will be released to him.  Physical prescription has been printed and left with nursing staff which can be released after patient   brings his current pill bottle in for verification.  Plan of Care  Pharmacotherapy (Medications Ordered): Meds ordered this encounter  Medications  . DISCONTD: HYDROcodone-acetaminophen (NORCO) 7.5-325 MG tablet    Sig: Take 1 tablet by mouth daily as needed for up to 30 days for moderate pain.    Dispense:  30 tablet    Refill:  0    Do not place this medication, or any other prescription from our practice, on "Automatic Refill". Patient may have prescription filled one day early if pharmacy is closed on scheduled refill date.  . HYDROcodone-acetaminophen (NORCO) 7.5-325 MG tablet    Sig: Take 1 tablet by mouth daily as needed for up to 30 days for moderate pain.    Dispense:  30 tablet    Refill:  0    Do not place this medication, or any other prescription from our practice, on "Automatic Refill". Patient may have prescription filled one day early if pharmacy is closed on scheduled refill date.   Time Note: Greater than 50% of the 25 minute(s) of face-to-face time spent with Mr. Lober, was spent in counseling/coordination of care regarding: Mr. Inge's primary cause of pain, the treatment plan, treatment alternatives, medication side effects, the opioid analgesic risks and possible complications, the appropriate use of his medications, realistic expectations, the goals of pain management (increased in functionality), the need to bring and keep the BMI below 30, the medication agreement and the patient's responsibilities when it comes to controlled substances.  Provider-requested follow-up: Return in about 4 weeks  (around 08/13/2018) for Medication Management.  Future Appointments  Date Time Provider Department Center  08/13/2018 10:30 AM Lateef, Bilal, MD [facility] None    Primary Care Physician: [facility] [facility] Location: [facility] Note by: Bilal Lateef, M.D Date: 07/16/2018; Time: 3:49 PM  There are no Patient Instructions on file for this visit. 

## 2018-07-16 NOTE — Progress Notes (Signed)
Nursing Pain Medication Assessment:  Safety precautions to be maintained throughout the outpatient stay will include: orient to surroundings, keep bed in low position, maintain call bell within reach at all times, provide assistance with transfer out of bed and ambulation.  Medication Inspection Compliance: Jeremy Conley did not comply with our request to bring his pills to be counted. He was reminded that bringing the medication bottles, even when empty, is a requirement.  Medication: None brought in. Pill/Patch Count: None available to be counted. Bottle Appearance: No container available. Did not bring bottle(s) to appointment. Filled Date: N/A Last Medication intake:  Today

## 2018-07-22 ENCOUNTER — Telehealth: Payer: Self-pay

## 2018-07-22 ENCOUNTER — Telehealth: Payer: Self-pay | Admitting: *Deleted

## 2018-07-22 NOTE — Telephone Encounter (Signed)
Rx to be picked up, patient was called and notified by swellons

## 2018-07-22 NOTE — Progress Notes (Signed)
Nursing Pain Medication Assessment:  Safety precautions to be maintained throughout the outpatient stay will include: orient to surroundings, keep bed in low position, maintain call bell within reach at all times, provide assistance with transfer out of bed and ambulation.  Medication Inspection Compliance: Pill count conducted under aseptic conditions, in front of the patient. Neither the pills nor the bottle was removed from the patient's sight at any time. Once count was completed pills were immediately returned to the patient in their original bottle.  Medication: Hydrocodone/APAP Pill/Patch Count: 0 of 30 pills remain Pill/Patch Appearance: Markings consistent with prescribed medication Bottle Appearance: Standard pharmacy container. Clearly labeled. Filled Date: 02 / 04/ 2020 Last Medication intake:  Day before yesterday

## 2018-07-23 ENCOUNTER — Telehealth: Payer: Self-pay | Admitting: Urology

## 2018-07-23 NOTE — Telephone Encounter (Signed)
Pt called and asked if he could have another medicine called in because the generic Viagra was not working. Please advise.

## 2018-07-27 NOTE — Telephone Encounter (Signed)
Can he try a different medication or does he need an appointment to discuss options?

## 2018-08-03 NOTE — Telephone Encounter (Signed)
I am happy to try something like [place] as an option but I suspect it Viagra is not working, it is unlikely this medication will be effective but it is worth trying.  The next up would be injections which we briefly discussed in the office.  If he like to pursue this, please walk him through the process of the test dose, as dose teaching, etc.  If he like to do this, please call in the test dose vial and have him bring into the office the following day for teaching.  [name], MD

## 2018-08-07 NOTE — Telephone Encounter (Signed)
Patient notified he would like to try [place] or Cialis

## 2018-08-07 NOTE — Telephone Encounter (Signed)
Please prescribe either one, #30 with 2 refills.    [name], MD

## 2018-08-10 MED ORDER — TADALAFIL 5 MG PO TABS: 5.0000 mg | ORAL_TABLET | Freq: Every day | ORAL | 0 refills | Status: DC | PRN

## 2018-08-10 NOTE — Telephone Encounter (Signed)
Cialis was sent to pharmacy. Patient notified.

## 2018-08-10 NOTE — Addendum Note (Signed)
Addended by: Kyra Manges on: 08/10/2018 12:05 PM   Modules accepted: Orders

## 2018-08-13 ENCOUNTER — Other Ambulatory Visit: Payer: Self-pay

## 2018-08-13 ENCOUNTER — Ambulatory Visit
Payer: Medicaid Other | Attending: Student in an Organized Health Care Education/Training Program | Admitting: Student in an Organized Health Care Education/Training Program

## 2018-08-13 ENCOUNTER — Encounter: Payer: Self-pay | Admitting: Student in an Organized Health Care Education/Training Program

## 2018-08-13 VITALS — BP 107/77 | HR 105 | Temp 98.2°F | Resp 20 | Ht 72.0 in | Wt 345.0 lb

## 2018-08-13 DIAGNOSIS — G894 Chronic pain syndrome: Secondary | ICD-10-CM | POA: Diagnosis not present

## 2018-08-13 DIAGNOSIS — M25562 Pain in left knee: Secondary | ICD-10-CM | POA: Diagnosis present

## 2018-08-13 DIAGNOSIS — G8929 Other chronic pain: Secondary | ICD-10-CM | POA: Diagnosis present

## 2018-08-13 DIAGNOSIS — M1712 Unilateral primary osteoarthritis, left knee: Secondary | ICD-10-CM | POA: Diagnosis not present

## 2018-08-13 DIAGNOSIS — M1711 Unilateral primary osteoarthritis, right knee: Secondary | ICD-10-CM

## 2018-08-13 DIAGNOSIS — M25561 Pain in right knee: Secondary | ICD-10-CM | POA: Diagnosis present

## 2018-08-13 MED ORDER — HYDROCODONE-ACETAMINOPHEN 7.5-325 MG PO TABS: 1.0000 | ORAL_TABLET | Freq: Every day | ORAL | 0 refills | Status: DC | PRN

## 2018-08-13 NOTE — Progress Notes (Signed)
Patient's Name: Jeremy Conley  MRN: [id number]  Referring Provider: [facility] [place] [facility]  DOB: 05/22/1970  PCP: [facility] [place] [facility]  DOS: 08/13/2018  Note by: Gillis Santa, MD  Service setting: Ambulatory outpatient  Specialty: Interventional Pain Management  Location: [facility] (AMB) Pain Management Facility    Patient type: Established   Primary Reason(s) for Visit: Encounter for prescription drug management. (Level of risk: moderate)  CC: Knee Pain (bilateral) and Ankle Pain (right)  HPI  Jeremy Conley is a 48 y.o. year old, male patient, who comes today for a medication management evaluation. He has Chronic pain syndrome; Morbid obesity ([place]); Bilateral primary osteoarthritis of knee; Essential hypertension; Dyspnea on effort; H/O arthroscopic knee surgery; Renal mass; Dissecting cellulitis of scalp; Chronic pain of both knees; Primary osteoarthritis of right knee; and Primary osteoarthritis of left knee on their problem list. His primarily concern today is the Knee Pain (bilateral) and Ankle Pain (right)  Pain Assessment: Location: Right, Left Knee Radiating: denies Onset: More than a month ago Duration: Chronic pain Quality: Aching, Discomfort(Grindin, crunching) Severity: 7 /10 (subjective, self-reported pain score)  Note: Reported level is inconsistent with clinical observations.                         When using our objective Pain Scale, levels between 6 and 10/10 are said to belong in an emergency room, as it progressively worsens from a 6/10, described as severely limiting, requiring emergency care not usually available at an outpatient pain management facility. At a 6/10 level, communication becomes difficult and requires great effort. Assistance to reach the emergency department may be required. Facial flushing and profuse sweating along with potentially dangerous increases in heart rate and blood pressure will be evident. Effect on ADL: bending, squating, jumping, prolonged walking and  sitting, long car rides Timing:   Modifying factors: meds, warm hot BP: 107/77  HR: (!) 105  Jeremy Conley was last scheduled for an appointment on 07/16/2018 for medication management. During today's appointment we reviewed Jeremy Conley's chronic pain status, as well as his outpatient medication regimen.  Patient follows up today for medication management.  Continues to endorse bilateral knee pain.  Endorses stressors in his life, patient lost his sister due to cancer yesterday.  States that he is handling this as best as he can.  States that on some days he needs to take 2 tablets of his hydrocodone.  We discussed increasing his monthly allotment to 45 so that he is able to take twice daily dosing on some days.  The patient  reports no history of drug use. His body mass index is 46.79 kg/m.  Further details on both, my assessment(s), as well as the proposed treatment plan, please see below.  Controlled Substance Pharmacotherapy Assessment REMS (Risk Evaluation and Mitigation Strategy)  Analgesic: Hydrocodone 7.5 mg daily PRN, quantity 30/month, will increase to quantity 45 MME/day: 7.5-15 mg/day.  Jeremy Specking, RN  08/13/2018 11:08 AM  Sign when Signing Visit Nursing Pain Medication Assessment:  Safety precautions to be maintained throughout the outpatient stay will include: orient to surroundings, keep bed in low position, maintain call bell within reach at all times, provide assistance with transfer out of bed and ambulation.  Medication Inspection Compliance: Pill count conducted under aseptic conditions, in front of the patient. Neither the pills nor the bottle was removed from the patient's sight at any time. Once count was completed pills were immediately returned to the patient in their  original bottle.  Medication: Hydrocodone/APAP Pill/Patch Count: 0 of 30 pills remain Pill/Patch Appearance: Markings consistent with prescribed medication Bottle Appearance: Standard pharmacy  container. Clearly labeled. Filled Date: 3 / 04 / 2020 Last Medication intake:  08/08/2018   Patient states that some days he has to take two pills. Pharmacokinetics: Liberation and absorption (onset of action): WNL Distribution (time to peak effect): WNL Metabolism and excretion (duration of action): WNL         Pharmacodynamics: Desired effects: Analgesia: Jeremy Conley reports >50% benefit. Functional ability: Patient reports that medication allows him to accomplish basic ADLs Clinically meaningful improvement in function (CMIF): Sustained CMIF goals met Perceived effectiveness: Described as relatively effective but with some room for improvement Undesirable effects: Side-effects or Adverse reactions: None reported Monitoring:  PMP: Online review of the past 23-monthperiod conducted. Compliant with practice rules and regulations Last UDS on record: Summary  Date Value Ref Range Status  12/10/2016 FINAL  Final    Comment:    ==================================================================== TOXASSURE COMP DRUG ANALYSIS,UR ==================================================================== Test                             Result       Flag       Units Drug Absent but Declared for Prescription Verification   Hydrocodone                    Not Detected UNEXPECTED ng/mg creat   Gabapentin                     Not Detected UNEXPECTED   Acetaminophen                  Not Detected UNEXPECTED    Acetaminophen, as indicated in the declared medication list, is    not always detected even when used as directed. ==================================================================== Test                      Result    Flag   Units      Ref Range   Creatinine              343              mg/dL      >=20 ==================================================================== Declared Medications:  The flagging and interpretation on this report are based on the  following declared medications.   Unexpected results may arise from  inaccuracies in the declared medications.  **Note: The testing scope of this panel includes these medications:  Gabapentin (Neurontin)  Hydrocodone (Norco)  Hydrocodone (Vicodin)  **Note: The testing scope of this panel does not include small to  moderate amounts of these reported medications:  Acetaminophen (Norco)  Acetaminophen (Vicodin)  **Note: The testing scope of this panel does not include following  reported medications:  Hydrochlorothiazide (Hydrodiuril)  Losartan (Cozaar)  Meloxicam (Mobic) ==================================================================== For clinical consultation, please call ([phone] ====================================================================    UDS interpretation: Compliant          Medication Assessment Form: Reviewed. Patient indicates being compliant with therapy Treatment compliance: Compliant Risk Assessment Profile: Aberrant behavior: See initial evaluations. None observed or detected today Comorbid factors increasing risk of overdose: See initial evaluation. No additional risks detected today Opioid risk tool (ORT):  Opioid Risk  08/13/2018  Alcohol -  Illegal Drugs -  Rx Drugs -  Alcohol 0  Illegal Drugs 0  Rx Drugs 0  Age between 32-45 years  -  History of Preadolescent Sexual Abuse 0  Psychological Disease 0  Depression 0  Opioid Risk Tool Scoring 0  Opioid Risk Interpretation Low Risk    ORT Scoring interpretation table:  Score <3 = Low Risk for SUD  Score between 4-7 = Moderate Risk for SUD  Score >8 = High Risk for Opioid Abuse   Risk of substance use disorder (SUD): Low  Risk Mitigation Strategies:  Patient Counseling: Covered Patient-Prescriber Agreement (PPA): Present and active  Notification to other healthcare providers: Done  Pharmacologic Plan: Increase quantity from 30/month to 45/month to allow twice daily as needed dosing             Laboratory Chemistry   Inflammation Markers (CRP: Acute Phase) (ESR: Chronic Phase) No results found for: CRP, ESRSEDRATE, LATICACIDVEN                       Rheumatology Markers No results found for: RF, ANA, LABURIC, URICUR, LYMEIGGIGMAB, LYMEABIGMQN, HLAB27                      Renal Function Markers Lab Results  Component Value Date   BUN 17 04/08/2017   CREATININE 1.15 03/24/2018   GFRAA >60 03/24/2018   GFRNONAA >60 03/24/2018                             Hepatic Function Markers No results found for: AST, ALT, ALBUMIN, ALKPHOS, HCVAB, AMYLASE, LIPASE, AMMONIA                      Electrolytes Lab Results  Component Value Date   NA 130 (L) 04/08/2017   K 3.5 04/08/2017   CL 96 (L) 04/08/2017   CALCIUM 9.2 04/08/2017                        Neuropathy Markers No results found for: VITAMINB12, FOLATE, HGBA1C, HIV                      CNS Tests No results found for: COLORCSF, APPEARCSF, RBCCOUNTCSF, WBCCSF, POLYSCSF, LYMPHSCSF, EOSCSF, PROTEINCSF, GLUCCSF, JCVIRUS, CSFOLI, IGGCSF                      Bone Pathology Markers No results found for: VD25OH, MY111NB5APO, LI1030DT1, YH8887NZ9, 25OHVITD1, 25OHVITD2, 25OHVITD3, TESTOFREE, TESTOSTERONE                       Coagulation Parameters Lab Results  Component Value Date   PLT 272 04/08/2017                        Cardiovascular Markers Lab Results  Component Value Date   TROPONINI <0.03 04/08/2017   HGB 14.2 04/08/2017   HCT 42.2 04/08/2017                         CA Markers No results found for: CEA, CA125, LABCA2                      Endocrine Markers No results found for: TSH, FREET4, TESTOFREE, TESTOSTERONE, ESTRADIOL, ESTRADIOLPCT, ESTRADIOLFRE                      Note: Lab  results reviewed.  Recent Diagnostic Imaging Results  IR Radiologist Eval & Mgmt Please refer to notes tab for details about interventional procedure. (Op  Note) [name] CONTRAST CLINICAL DATA:  Follow-up left renal mass.  EXAM: MRI  ABDOMEN WITHOUT AND WITH CONTRAST  TECHNIQUE: Multiplanar multisequence MR imaging of the abdomen was performed both before and after the administration of intravenous contrast.  CONTRAST:  10 cc Gadavist IV.  COMPARISON:  08/21/2017 MRI abdomen.  FINDINGS: Postcontrast sequences are significantly motion degraded, limiting assessment.  Lower chest: No acute abnormality at the lung bases.  Hepatobiliary: Normal liver size and configuration. There is marked diffuse hepatic steatosis on chemical shift imaging. No liver mass. Normal gallbladder with no cholelithiasis. No biliary ductal dilatation. Common bile duct diameter 4 mm. No choledocholithiasis.  Pancreas: No pancreatic mass or duct dilation.  No pancreas divisum.  Spleen: Normal size. No mass.  Adrenals/Urinary Tract: Normal adrenals. No hydronephrosis. There is a 1.6 x 1.5 cm renal cortical mass in the posterior lower left kidney (series 7/image 19), which demonstrates heterogeneous T2 signal intensity and heterogeneous enhancement, increased from 1.0 x 0.9 cm on 08/21/2017 MRI. Simple small upper right renal cysts, largest 1.1 cm. Two similar small exophytic left renal cortical lesions measuring 1.6 cm (series 7/image 12) and 1.1 cm (series 7/image 15) are stable in size since 08/21/2017 MRI and demonstrate slight T1 hyperintensity and no convincing enhancement, compatible with Bosniak category 2 hemorrhagic/proteinaceous renal cysts.  Stomach/Bowel: Normal non-distended stomach. Visualized small and large bowel is normal caliber, with no bowel wall thickening.  Vascular/Lymphatic: Normal caliber abdominal aorta. Patent portal, splenic, hepatic and renal veins. No tumor thrombus in the renal veins. No pathologically enlarged lymph nodes in the abdomen.  Other: No abdominal ascites or focal fluid collection.  Musculoskeletal: No aggressive appearing focal osseous lesions.  IMPRESSION: 1. Mild interval growth of  enhancing renal cortical mass in the posterior lower left kidney, now 1.6 x 1.5 cm, most compatible with renal cell carcinoma. 2. Patent renal veins with no tumor thrombus. No evidence of metastatic disease in the abdomen. 3. Small Bosniak category 1 and category 2 renal cysts. 4. Marked diffuse hepatic steatosis.  Electronically Signed   By: Ilona Sorrel M.D.   On: 03/24/2018 15:49  Complexity Note: Imaging results reviewed. Results shared with Mr. Simson, using Layman's terms.                               Meds   Current Outpatient Medications:  .  aspirin EC 81 MG tablet, Take 81 mg by mouth daily., Disp: , Rfl:  .  clindamycin (CLEOCIN T) 1 % external solution, Apply topically to scalp and face daily., Disp: , Rfl:  .  ergocalciferol (VITAMIN D2) 50000 units capsule, Take 50,000 Units by mouth once a week., Disp: , Rfl:  .  hydrochlorothiazide (HYDRODIURIL) 25 MG tablet, Take 25 mg by mouth daily., Disp: , Rfl:  .  HYDROcodone-acetaminophen (NORCO) 7.5-325 MG tablet, Take 1-2 tablets by mouth daily as needed for up to 30 days for moderate pain., Disp: 45 tablet, Rfl: 0 .  losartan (COZAAR) 100 MG tablet, Take 100 mg by mouth daily., Disp: , Rfl:  .  meloxicam (MOBIC) 7.5 MG tablet, Take 7.5 mg by mouth., Disp: , Rfl:  .  sildenafil (REVATIO) 20 MG tablet, Take 1 tablet (20 mg total) by mouth as needed. Take 1-5 tabs as needed prior to intercourse,  Disp: 30 tablet, Rfl: 11 .  tadalafil (CIALIS) 5 MG tablet, Take 1 tablet (5 mg total) by mouth daily as needed for erectile dysfunction., Disp: 30 tablet, Rfl: 0 .  triamcinolone cream (KENALOG) 0.1 %, Apply to rash on arm 1-2 times daily as needed., Disp: , Rfl:  .  atorvastatin (LIPITOR) 20 MG tablet, Take 20 mg by mouth., Disp: , Rfl:  .  [START ON 09/12/2018] HYDROcodone-acetaminophen (NORCO) 7.5-325 MG tablet, Take 1-2 tablets by mouth daily as needed for up to 30 days for severe pain. Must last 30 days., Disp: 45 tablet, Rfl: 0 .   [START ON 10/12/2018] HYDROcodone-acetaminophen (NORCO) 7.5-325 MG tablet, Take 1-2 tablets by mouth daily as needed for up to 30 days for severe pain. Must last 30 days., Disp: 45 tablet, Rfl: 0 .  Omeprazole 20 MG TBEC, Take by mouth., Disp: , Rfl:   ROS  Constitutional: Denies any fever or chills Gastrointestinal: No reported hemesis, hematochezia, vomiting, or acute GI distress Musculoskeletal: Denies any acute onset joint swelling, redness, loss of ROM, or weakness Neurological: No reported episodes of acute onset apraxia, aphasia, dysarthria, agnosia, amnesia, paralysis, loss of coordination, or loss of consciousness  Allergies  Mr. Pringle has No Known Allergies.  PFSH  Drug: Mr. Seehafer  reports no history of drug use. Alcohol:  reports current alcohol use. Tobacco:  reports that he has been smoking cigars. He has smoked for the past 15.00 years. He has never used smokeless tobacco. Medical:  has a past medical history of Arthritis, Arthritis, Depression, GERD (gastroesophageal reflux disease), Hyperlipidemia, Hypertension, and Kidney mass. Surgical: Mr. Kraai  has a past surgical history that includes Knee arthroscopy (Left, 1993 and 1997); IR Radiologist Eval & Mgmt (11/19/2017); and IR Radiologist Eval & Mgmt (03/24/2018). Family: family history includes Heart disease in his father.  Constitutional Exam  General appearance: Well nourished, well developed, and well hydrated. In no apparent acute distress Vitals:   08/13/18 1057  BP: 107/77  Pulse: (!) 105  Resp: 20  Temp: 98.2 F (36.8 C)  SpO2: 97%  Weight: (!) 345 lb (156.5 kg)  Height: 6' (1.829 m)   BMI Assessment: Estimated body mass index is 46.79 kg/m as calculated from the following:   Height as of this encounter: 6' (1.829 m).   Weight as of this encounter: 345 lb (156.5 kg).  BMI interpretation table: BMI level Category Range association with higher incidence of chronic pain  <18 kg/m2 Underweight   18.5-24.9  kg/m2 Ideal body weight   25-29.9 kg/m2 Overweight Increased incidence by 20%  30-34.9 kg/m2 Obese (Class I) Increased incidence by 68%  35-39.9 kg/m2 Severe obesity (Class II) Increased incidence by 136%  >40 kg/m2 Extreme obesity (Class III) Increased incidence by 254%   Patient's current BMI Ideal Body weight  Body mass index is 46.79 kg/m. Ideal body weight: 77.6 kg (171 lb 1.2 oz) Adjusted ideal body weight: 109.2 kg (240 lb 10.3 oz)   BMI Readings from Last 4 Encounters:  08/13/18 46.79 kg/m  07/16/18 46.79 kg/m  06/23/18 46.79 kg/m  03/24/18 46.11 kg/m   Wt Readings from Last 4 Encounters:  08/13/18 (!) 345 lb (156.5 kg)  07/16/18 (!) 345 lb (156.5 kg)  06/23/18 (!) 345 lb (156.5 kg)  11/19/17 (!) 340 lb (154.2 kg)  Psych/Mental status: Alert, oriented x 3 (person, place, & time)       Eyes: PERLA Respiratory: No evidence of acute respiratory distress  Cervical Spine Area Exam  Skin &  Axial Inspection: No masses, redness, edema, swelling, or associated skin lesions Alignment: Symmetrical Functional ROM: Unrestricted ROM      Stability: No instability detected Muscle Tone/Strength: Functionally intact. No obvious neuro-muscular anomalies detected. Sensory (Neurological): Unimpaired Palpation: No palpable anomalies              Upper Extremity (UE) Exam    Side: Right upper extremity  Side: Left upper extremity  Skin & Extremity Inspection: Skin color, temperature, and hair growth are WNL. No peripheral edema or cyanosis. No masses, redness, swelling, asymmetry, or associated skin lesions. No contractures.  Skin & Extremity Inspection: Skin color, temperature, and hair growth are WNL. No peripheral edema or cyanosis. No masses, redness, swelling, asymmetry, or associated skin lesions. No contractures.  Functional ROM: Unrestricted ROM          Functional ROM: Unrestricted ROM          Muscle Tone/Strength: Functionally intact. No obvious neuro-muscular anomalies  detected.  Muscle Tone/Strength: Functionally intact. No obvious neuro-muscular anomalies detected.  Sensory (Neurological): Unimpaired          Sensory (Neurological): Unimpaired          Palpation: No palpable anomalies              Palpation: No palpable anomalies              Provocative Test(s):  Phalen's test: deferred Tinel's test: deferred Apley's scratch test (touch opposite shoulder):  Action 1 (Across chest): deferred Action 2 (Overhead): deferred Action 3 (LB reach): deferred   Provocative Test(s):  Phalen's test: deferred Tinel's test: deferred Apley's scratch test (touch opposite shoulder):  Action 1 (Across chest): deferred Action 2 (Overhead): deferred Action 3 (LB reach): deferred    Thoracic Spine Area Exam  Skin & Axial Inspection: No masses, redness, or swelling Alignment: Symmetrical Functional ROM: Unrestricted ROM Stability: No instability detected Muscle Tone/Strength: Functionally intact. No obvious neuro-muscular anomalies detected. Sensory (Neurological): Unimpaired Muscle strength & Tone: No palpable anomalies  Lumbar Spine Area Exam  Skin & Axial Inspection: No masses, redness, or swelling Alignment: Symmetrical Functional ROM: Unrestricted ROM       Stability: No instability detected Muscle Tone/Strength: Functionally intact. No obvious neuro-muscular anomalies detected. Sensory (Neurological): Unimpaired Palpation: No palpable anomalies       Provocative Tests: Hyperextension/rotation test: deferred today       Lumbar quadrant test (Kemp's test): deferred today       Lateral bending test: deferred today       Patrick's Maneuver: deferred today                   FABER* test: deferred today                   S-I anterior distraction/compression test: deferred today         S-I lateral compression test: deferred today         S-I Thigh-thrust test: deferred today         S-I Gaenslen's test: deferred today         *(Flexion, ABduction and  External Rotation)  Gait & Posture Assessment  Ambulation: Unassisted Gait: Relatively normal for age and body habitus Posture: WNL   Lower Extremity Exam    Side: Right lower extremity  Side: Left lower extremity  Stability: No instability observed          Stability: No instability observed          Skin &  Extremity Inspection: Skin color, temperature, and hair growth are WNL. No peripheral edema or cyanosis. No masses, redness, swelling, asymmetry, or associated skin lesions. No contractures.  Skin & Extremity Inspection: Skin color, temperature, and hair growth are WNL. No peripheral edema or cyanosis. No masses, redness, swelling, asymmetry, or associated skin lesions. No contractures.  Functional ROM: Pain restricted ROM for knee joint          Functional ROM: Pain restricted ROM for knee joint          Muscle Tone/Strength: Functionally intact. No obvious neuro-muscular anomalies detected.  Muscle Tone/Strength: Functionally intact. No obvious neuro-muscular anomalies detected.  Sensory (Neurological): Arthropathic arthralgia        Sensory (Neurological): Arthropathic arthralgia        DTR: Patellar: deferred today Achilles: deferred today Plantar: deferred today  DTR: Patellar: deferred today Achilles: deferred today Plantar: deferred today  Palpation: No palpable anomalies  Palpation: No palpable anomalies   Assessment   Status Diagnosis  Persistent Persistent Persistent 1. Chronic pain syndrome   2. Primary osteoarthritis of right knee   3. Primary osteoarthritis of left knee   4. Morbid obesity ([place])   5. Chronic pain of both knees      General Recommendations: The pain condition that the patient suffers from is best treated with a multidisciplinary approach that involves an increase in physical activity to prevent de-conditioning and worsening of the pain cycle, as well as psychological counseling (formal and/or informal) to address the co-morbid psychological  affects of pain. Treatment will often involve judicious use of pain medications and interventional procedures to decrease the pain, allowing the patient to participate in the physical activity that will ultimately produce long-lasting pain reductions. The goal of the multidisciplinary approach is to return the patient to a higher level of overall function and to restore their ability to perform activities of daily living.  48 year old male with history of morbid obesity, chronic pain syndrome secondary to primary osteoarthritis of bilateral knees.  Patient is status post bilateral genicular nerve block as well as intra-articular knee steroid injections done elsewhere which were not helpful.  He is not interested in repeating these at [facility].Knee replacement surgery has been recommended however patient does have a renal mass that is growing in size. Orthopedic team wants to defer surgery untilmass stops expanding. Patient has surveillance imaging studies every 6 months to monitor renal mass size and growth.  Next surveillance imaging is in April.  Patient presents today for medication management.  States that on some days he is having to take 2 tablets of hydrocodone.  Will increase monthly quantity to quantity 45, no further dose escalations beyond this.  Will provide prescription for 3 months given COVID-19.  Plan of Care  Pharmacotherapy (Medications Ordered): Meds ordered this encounter  Medications  . HYDROcodone-acetaminophen (NORCO) 7.5-325 MG tablet    Sig: Take 1-2 tablets by mouth daily as needed for up to 30 days for moderate pain.    Dispense:  45 tablet    Refill:  0    Do not place this medication, or any other prescription from our practice, on "Automatic Refill". Patient may have prescription filled one day early if pharmacy is closed on scheduled refill date.  Marland Kitchen HYDROcodone-acetaminophen (NORCO) 7.5-325 MG tablet    Sig: Take 1-2 tablets by mouth daily as needed for up to 30 days  for severe pain. Must last 30 days.    Dispense:  45 tablet    Refill:  0  [place] STOP ACT - Not applicable. Fill one day early if pharmacy is closed on scheduled refill date.  Marland Kitchen HYDROcodone-acetaminophen (NORCO) 7.5-325 MG tablet    Sig: Take 1-2 tablets by mouth daily as needed for up to 30 days for severe pain. Must last 30 days.    Dispense:  45 tablet    Refill:  0    [place] STOP ACT - Not applicable. Fill one day early if pharmacy is closed on scheduled refill date.    Provider-requested follow-up: Return in about 3 months (around 11/13/2018) for Medication Management.  No future appointments.  Primary Care Physician: [facility] [place] [facility] Location: [facility] Note by: Gillis Santa, M.D Date: 08/13/2018; Time: 11:28 AM  There are no Patient Instructions on file for this visit.

## 2018-08-13 NOTE — Progress Notes (Signed)
Nursing Pain Medication Assessment:  Safety precautions to be maintained throughout the outpatient stay will include: orient to surroundings, keep bed in low position, maintain call bell within reach at all times, provide assistance with transfer out of bed and ambulation.  Medication Inspection Compliance: Pill count conducted under aseptic conditions, in front of the patient. Neither the pills nor the bottle was removed from the patient's sight at any time. Once count was completed pills were immediately returned to the patient in their original bottle.  Medication: Hydrocodone/APAP Pill/Patch Count: 0 of 30 pills remain Pill/Patch Appearance: Markings consistent with prescribed medication Bottle Appearance: Standard pharmacy container. Clearly labeled. Filled Date: 3 / 04 / 2020 Last Medication intake:  08/08/2018   Patient states that some days he has to take two pills.

## 2018-08-14 ENCOUNTER — Telehealth: Payer: Self-pay | Admitting: Urology

## 2018-08-14 MED ORDER — TADALAFIL 5 MG PO TABS: 5.0000 mg | ORAL_TABLET | Freq: Every day | ORAL | 0 refills | Status: DC | PRN

## 2018-08-14 NOTE — Telephone Encounter (Signed)
Rx sent to [facility] per patient request. [facility] cancelled.

## 2018-08-14 NOTE — Telephone Encounter (Signed)
Pt would like RX sent to [facility] in [place] because he can get it cheaper.

## 2018-10-30 ENCOUNTER — Other Ambulatory Visit: Payer: Self-pay | Admitting: Interventional Radiology

## 2018-10-30 DIAGNOSIS — N2889 Other specified disorders of kidney and ureter: Secondary | ICD-10-CM

## 2018-11-09 ENCOUNTER — Other Ambulatory Visit: Payer: Self-pay

## 2018-11-09 ENCOUNTER — Ambulatory Visit (HOSPITAL_COMMUNITY)
Admission: RE | Admit: 2018-11-09 | Discharge: 2018-11-09 | Disposition: A | Discharge status: Home / Self Care | Payer: Medicaid Other | Source: Ambulatory Visit | Attending: Interventional Radiology | Admitting: Interventional Radiology

## 2018-11-09 DIAGNOSIS — N2889 Other specified disorders of kidney and ureter: Secondary | ICD-10-CM | POA: Insufficient documentation

## 2018-11-09 LAB — CREATININE, SERUM
Creatinine, Ser: 1.52 mg/dL — ABNORMAL HIGH (ref 0.61–1.24)
GFR calc Af Amer: >60 mL/min (ref >60)
GFR calc non Af Amer: 53 mL/min — ABNORMAL LOW (ref >60)

## 2018-11-09 MED ORDER — GADOBUTROL 1 MMOL/ML IV SOLN
10.0000 mL | Freq: Once | INTRAVENOUS | Status: AC | PRN
  Administered 2018-11-09: 10 mL via INTRAVENOUS

## 2018-11-12 ENCOUNTER — Ambulatory Visit
Admission: RE | Admit: 2018-11-12 | Discharge: 2018-11-12 | Disposition: A | Discharge status: Home / Self Care | Payer: Medicaid Other | Source: Ambulatory Visit | Attending: Interventional Radiology | Admitting: Interventional Radiology

## 2018-11-12 ENCOUNTER — Other Ambulatory Visit: Payer: Self-pay

## 2018-11-12 ENCOUNTER — Encounter: Payer: Medicaid Other | Admitting: Student in an Organized Health Care Education/Training Program

## 2018-11-12 ENCOUNTER — Encounter: Payer: Self-pay | Admitting: *Deleted

## 2018-11-12 DIAGNOSIS — N2889 Other specified disorders of kidney and ureter: Secondary | ICD-10-CM

## 2018-11-12 HISTORY — PX: IR RADIOLOGIST EVAL & MGMT: IMG5224

## 2018-11-12 NOTE — Progress Notes (Signed)
Patient ID: Jeremy Conley, male   DOB: Mar 28, 1971, 48 y.o.   MRN: [id number]         Chief Complaint: Patient was consulted remotely today (TeleHealth) for No chief complaint on file.  at the request of Petrina Melby.    Referring Physician(s): Erlene Quan (Urology)  History of Present Illness:  Jeremy Conley is a 48 y.o. male with past medical history significant for hypertension, hyperlipidemia and morbid obesity who was found to have an indeterminate left-sided renal lesion on outside abdominal CT performed for the work-up of a palpable knot within his mid abdomen (which was ultimately attributable to gastritis).  Patient was initially seen in the interventional radiology clinic on 11/19/2017 and subsequently on 03/24/2018, and has elected to pursue conservative management with continued observation of indolent behaving left-sided renal lesion.  Patient is now and seen in consultation via telemedicine after undergoing surveillance abdominal MRI performed 11/09/2018.  Patient is again without complaint.  Specifically, no flank pain or hematuria.  Patient continues to attempt weight loss strategies including walking, exercise and being diligent with his prescribed medicine.   Past Medical History:  Diagnosis Date  . Arthritis   . Arthritis   . Depression   . GERD (gastroesophageal reflux disease)   . Hyperlipidemia   . Hypertension   . Kidney mass     Past Surgical History:  Procedure Laterality Date  . IR RADIOLOGIST EVAL & MGMT  11/19/2017  . IR RADIOLOGIST EVAL & MGMT  03/24/2018  . KNEE ARTHROSCOPY Left 1993 and 1997    Allergies: Patient has no known allergies.  Medications: Prior to Admission medications   Medication Sig Start Date End Date Taking? Authorizing Provider  aspirin EC 81 MG tablet Take 81 mg by mouth daily.    [provider]  atorvastatin (LIPITOR) 20 MG tablet Take 20 mg by mouth. 01/16/17 06/23/18  [provider]  clindamycin (CLEOCIN  T) 1 % external solution Apply topically to scalp and face daily. 01/14/17   [provider]  ergocalciferol (VITAMIN D2) 50000 units capsule Take 50,000 Units by mouth once a week.    [provider]  hydrochlorothiazide (HYDRODIURIL) 25 MG tablet Take 25 mg by mouth daily. 04/20/18   [provider]  losartan (COZAAR) 100 MG tablet Take 100 mg by mouth daily. 04/20/18   [provider]  meloxicam (MOBIC) 7.5 MG tablet Take 7.5 mg by mouth. 03/12/17   [provider]  Omeprazole 20 MG TBEC Take by mouth. 08/06/17 06/23/18  [provider]  sildenafil (REVATIO) 20 MG tablet Take 1 tablet (20 mg total) by mouth as needed. Take 1-5 tabs as needed prior to intercourse 06/24/18   [name], MD  tadalafil (CIALIS) 5 MG tablet Take 1 tablet (5 mg total) by mouth daily as needed for erectile dysfunction. 08/14/18   [name], MD  triamcinolone cream (KENALOG) 0.1 % Apply to rash on arm 1-2 times daily as needed. 01/07/17   [provider]     Family History  Problem Relation Age of Onset  . Heart disease Father   . Bladder Cancer Neg Hx   . Kidney cancer Neg Hx   . Prostate cancer Neg Hx     Social History   Socioeconomic History  . Marital status: Single    Spouse name: Not on file  . Number of children: Not on file  . Years of education: Not on file  . Highest education level: Not on file  Occupational  History  . Not on file  Social Needs  . Financial resource strain: Not on file  . Food insecurity    Worry: Not on file    Inability: Not on file  . Transportation needs    Medical: Not on file    Non-medical: Not on file  Tobacco Use  . Smoking status: Light Tobacco Smoker    Years: 15.00    Types: Cigars  . Smokeless tobacco: Never Used  Substance and Sexual Activity  . Alcohol use: Yes    Comment: occassional  . Drug use: No  . Sexual activity: Yes    Birth control/protection: None  Lifestyle  . Physical  activity    Days per week: Not on file    Minutes per session: Not on file  . Stress: Not on file  Relationships  . Social Herbalist on phone: Not on file    Gets together: Not on file    Attends religious service: Not on file    Active member of club or organization: Not on file    Attends meetings of clubs or organizations: Not on file    Relationship status: Not on file  Other Topics Concern  . Not on file  Social History Narrative  . Not on file    ECOG Status: 0 - Asymptomatic  Review of Systems  Review of Systems: A 12 point ROS discussed and pertinent positives are indicated in the HPI above.  All other systems are negative.  Physical Exam No direct physical exam was performed (except for noted visual exam findings with Video Visits).   Vital Signs: There were no vitals taken for this visit.  Imaging: [name] Contrast  Result Date: 11/09/2018 CLINICAL DATA:  Follow-up left kidney lesion. EXAM: MRI ABDOMEN WITHOUT AND WITH CONTRAST TECHNIQUE: Multiplanar multisequence MR imaging of the abdomen was performed both before and after the administration of intravenous contrast. CONTRAST:  10 cc of Gadavist COMPARISON:  03/24/2018 FINDINGS: Lower chest: No acute findings. Hepatobiliary: Diffuse hepatic steatosis. No focal liver abnormality. Partially collapsed gallbladder. No gallstones or biliary ductal dilatation. Pancreas: No mass, inflammatory changes, or other parenchymal abnormality identified. Spleen:  Within normal limits in size and appearance. Adrenals/Urinary Tract:  Normal appearance of the adrenal glands. Unfortunately, there is motion artifact and artifact created by increased body habitus which on today's exam obscures the area of interest within the inferior pole of the left kidney, particularly on the postcontrast images. Best seen on the coronal images, the previously characterized enhancing lesion measures approximately 1.6 cm, image 24/30.  Unchanged from previous study. On the prior exam this study exhibit scratch set previously this lesion exhibited heterogeneous enhancement concerning for renal cell carcinoma. Two simple appearing cysts arising from the lateral cortex of the left kidney are again noted arising from the lateral and posterior cortex of the left kidney. These measure 1.1 cm and 1.6 cm, image 30/19 and image 27/19. Unchanged from previous exam. Small cystic lesion arising from upper pole of the right kidney is unchanged measuring 1.1 cm, image 22/19. No hydronephrosis identified bilaterally. Stomach/Bowel: Visualized portions within the abdomen are unremarkable. Vascular/Lymphatic: No pathologically enlarged lymph nodes identified. No abdominal aortic aneurysm demonstrated. Other:  None. Musculoskeletal: No suspicious bone lesions identified. IMPRESSION: 1. Exam detail is diminished secondary to body habitus. There is also motion artifact which diminishes exam detail. 2. Arising from inferior pole of left kidney is and partially exophytic enhancing lesion which is not significantly changed  in size from previous exam but remains suspicious for small renal cell carcinoma. 3. Additional small, bilateral kidney lesions have signal and enhancement characteristics compatible with simple cysts. Similar to previous study. 4. Diffuse hepatic steatosis. Electronically Signed   By: Kerby Moors M.D.   On: 11/09/2018 15:42    Labs:  CBC: No results for input(s): WBC, HGB, HCT, PLT in the last 8760 hours.  COAGS: No results for input(s): INR, APTT in the last 8760 hours.  BMP: Recent Labs    03/24/18 1330 11/09/18 1140  CREATININE 1.15 1.52*  GFRNONAA >60 53*  GFRAA >60 >60    LIVER FUNCTION TESTS: No results for input(s): BILITOT, AST, ALT, ALKPHOS, PROT, ALBUMIN in the last 8760 hours.  TUMOR MARKERS: No results for input(s): AFPTM, CEA, CA199, CHROMGRNA in the last 8760 hours.  Assessment and Plan:  Jeremy Conley  is a 48 y.o. male with past medical history significant for hypertension, hyperlipidemia and morbid obesity who has been followed for an incidentally discovered asymptomatic left-sided renal lesion.  I explained to the patient that as the left-sided renal lesion demonstrates imaging characteristics compatible with a renal cell carcinoma, and as such, biopsy is not warranted.  Fortunately, the abdominal MRI performed 11/09/2018 (though again limited due to patient body habitus and respiratory artifact), demonstrates stability of the partially exophytic left-sided renal lesion 1.6 cm, unchanged compared to the 03/24/2018 examination, though slightly increased in size compared to the 08/21/2017 examination at which time it measured approximately 1 cm.  Proceeding with definitive renal cryoablation versus continued surveillance was discussed in detail with the patient who at this time wishes to pursue continued surveillance.  I think given the indolent nature of the left-sided renal lesion as well as the current COVID-19 pandemic, this is a reasonable strategy/decision.  As such, I will arrange for the patient to undergo a surveillance renal protocol CT scan in 6 months (late December/early January).  I have elected to change in imaging modalities from MRI to CT given patient's body habitus and difficulty performing necessary breath-hold's.  Additionally, if/when cryoablation is pursued, this procedure would entail CT guidance.  The patient is in agreement with above plan of care and knows to call the interventional radiology clinic with any future questions or concerns.  PLAN: - Continued surveillance. - Follow-up renal protocol CT scan of the abdomen and pelvis in 6 months (late December/early January)  Thank you for this interesting consult.  I greatly enjoyed meeting Jeremy Conley and look forward to participating in their care.  A copy of this report was sent to the requesting provider on this date.   Electronically Signed: Sandi Mariscal 11/12/2018, 9:37 AM   I spent a total of 10 Minutes in remote  clinical consultation, greater than 50% of which was counseling/coordinating care for left-sided renal lesion.    Visit type: Audio only (telephone). Audio (no video) only due to Inability to coordinate video conference. Alternative for in-person consultation at [facility], [address], [place], [place]. This visit type was conducted due to national recommendations for restrictions regarding the COVID-19 Pandemic (e.g. social distancing).  This format is felt to be most appropriate for this patient at this time.  All issues noted in this document were discussed and addressed.

## 2018-11-17 ENCOUNTER — Ambulatory Visit
Payer: Medicaid Other | Attending: Student in an Organized Health Care Education/Training Program | Admitting: Student in an Organized Health Care Education/Training Program

## 2018-11-17 ENCOUNTER — Other Ambulatory Visit: Payer: Self-pay

## 2018-11-17 ENCOUNTER — Encounter: Payer: Self-pay | Admitting: Student in an Organized Health Care Education/Training Program

## 2018-11-17 VITALS — BP 149/98 | HR 99 | Temp 98.6°F | Resp 16 | Ht 72.0 in | Wt 345.0 lb

## 2018-11-17 DIAGNOSIS — I10 Essential (primary) hypertension: Secondary | ICD-10-CM | POA: Diagnosis present

## 2018-11-17 DIAGNOSIS — M1711 Unilateral primary osteoarthritis, right knee: Secondary | ICD-10-CM

## 2018-11-17 DIAGNOSIS — G894 Chronic pain syndrome: Secondary | ICD-10-CM

## 2018-11-17 DIAGNOSIS — G8929 Other chronic pain: Secondary | ICD-10-CM

## 2018-11-17 DIAGNOSIS — M25561 Pain in right knee: Secondary | ICD-10-CM | POA: Diagnosis present

## 2018-11-17 DIAGNOSIS — M25562 Pain in left knee: Secondary | ICD-10-CM | POA: Diagnosis present

## 2018-11-17 DIAGNOSIS — M1712 Unilateral primary osteoarthritis, left knee: Secondary | ICD-10-CM | POA: Diagnosis present

## 2018-11-17 MED ORDER — HYDROCODONE-ACETAMINOPHEN 7.5-325 MG PO TABS: 1.0000 | ORAL_TABLET | Freq: Every day | ORAL | 0 refills | Status: DC

## 2018-11-17 NOTE — Progress Notes (Signed)
Patient's Name: Jeremy Conley  MRN: [id number]  Referring Provider: [facility] [place] [facility]  DOB: Jul 15, 1970  PCP: [facility] [place] [facility]  DOS: 11/17/2018  Note by: Gillis Santa, MD  Service setting: Ambulatory outpatient  Attending: Gillis Santa, MD  Location: [facility] (AMB) Pain Management Facility  Specialty: Interventional Pain Management  Patient type: Established   Primary Reason(s) for Visit: Encounter for prescription drug management. (Level of risk: moderate)  CC: Knee Pain (bilateral )  HPI  Jeremy Conley is a 48 y.o. year old, male patient, who comes today for a medication management evaluation. He has Chronic pain syndrome; Morbid obesity ([place]); Bilateral primary osteoarthritis of knee; Essential hypertension; Dyspnea on effort; H/O arthroscopic knee surgery; Renal mass; Dissecting cellulitis of scalp; Chronic pain of both knees; Primary osteoarthritis of right knee; and Primary osteoarthritis of left knee on their problem list. His primarily concern today is the Knee Pain (bilateral )  Pain Assessment: Location: Left, Right Knee Radiating: denies Onset: More than a month ago Duration: Chronic pain Quality: Discomfort, Constant(describes as bone on bone, feels like a rubber band snapping, or lightning bolt) Severity: 7 /10 (subjective, self-reported pain score)  Note: Reported level is compatible with observation.                         When using our objective Pain Scale, levels between 6 and 10/10 are said to belong in an emergency room, as it progressively worsens from a 6/10, described as severely limiting, requiring emergency care not usually available at an outpatient pain management facility. At a 6/10 level, communication becomes difficult and requires great effort. Assistance to reach the emergency department may be required. Facial flushing and profuse sweating along with potentially dangerous increases in heart rate and blood pressure will be evident. Effect on ADL: medications are  allowing him to do more activity Timing: Constant Modifying factors: medications BP: (!) 149/98  HR: 99  Jeremy Conley was last scheduled for an appointment on 08/13/2018 for medication management. During today's appointment we reviewed Jeremy Conley chronic pain status, as well as his outpatient medication regimen.  No change in medical history since patient's last visit with me.  Patient did have repeat ultrasound of his abdomen in regards to his kidney lesion.  His left kidney lesion is significantly unchanged in size since his last MRI which was 6 months prior.  Dr. Pascal Lux will continue to monitor.  Patient is utilizing his medications as prescribed and they help manage his pain and also help him function.  He states that he is walking around more.  The patient  reports no history of drug use. His body mass index is 46.79 kg/m.  Further details on both, my assessment(s), as well as the proposed treatment plan, please see below.  Controlled Substance Pharmacotherapy Assessment REMS (Risk Evaluation and Mitigation Strategy)   10/12/2018  1   08/13/2018  Hydrocodone-Acetamin 7.5-325  45.00 30 Bi Lat   [id number]   [place] (4231)   0  11.25 MME  Medicaid   [place]    Janett Billow, RN  11/17/2018  9:54 AM  Sign when Signing Visit Nursing Pain Medication Assessment:  Safety precautions to be maintained throughout the outpatient stay will include: orient to surroundings, keep bed in low position, maintain call bell within reach at all times, provide assistance with transfer out of bed and ambulation.  Medication Inspection Compliance: Pill count conducted under aseptic conditions, in front of the patient. Neither the  pills nor the bottle was removed from the patient's sight at any time. Once count was completed pills were immediately returned to the patient in their original bottle.  Medication: Hydrocodone/APAP Pill/Patch Count: 0 of 45 pills remain Pill/Patch Appearance: Markings consistent with  prescribed medication Bottle Appearance: Standard pharmacy container. Clearly labeled. Filled Date: 05 / 25 / 2020 Last Medication intake:  Ran out of medicine more than 48 hours ago   Pharmacokinetics: Liberation and absorption (onset of action): WNL Distribution (time to peak effect): WNL Metabolism and excretion (duration of action): WNL         Pharmacodynamics: Desired effects: Analgesia: Mr. Farabee reports >50% benefit. Functional ability: Patient reports that medication allows him to accomplish basic ADLs Clinically meaningful improvement in function (CMIF): Sustained CMIF goals met Perceived effectiveness: Described as relatively effective, allowing for increase in activities of daily living (ADL) Undesirable effects: Side-effects or Adverse reactions: None reported Monitoring: [place] PMP: PDMP reviewed during this encounter. Online review of the past 10-monthperiod conducted. Compliant with practice rules and regulations Last UDS on record: Summary  Date Value Ref Range Status  12/10/2016 FINAL  Final    Comment:    ==================================================================== TOXASSURE COMP DRUG ANALYSIS,UR ==================================================================== Test                             Result       Flag       Units Drug Absent but Declared for Prescription Verification   Hydrocodone                    Not Detected UNEXPECTED ng/mg creat   Gabapentin                     Not Detected UNEXPECTED   Acetaminophen                  Not Detected UNEXPECTED    Acetaminophen, as indicated in the declared medication list, is    not always detected even when used as directed. ==================================================================== Test                      Result    Flag   Units      Ref Range   Creatinine              343              mg/dL      >=20 ==================================================================== Declared Medications:   The flagging and interpretation on this report are based on the  following declared medications.  Unexpected results may arise from  inaccuracies in the declared medications.  **Note: The testing scope of this panel includes these medications:  Gabapentin (Neurontin)  Hydrocodone (Norco)  Hydrocodone (Vicodin)  **Note: The testing scope of this panel does not include small to  moderate amounts of these reported medications:  Acetaminophen (Norco)  Acetaminophen (Vicodin)  **Note: The testing scope of this panel does not include following  reported medications:  Hydrochlorothiazide (Hydrodiuril)  Losartan (Cozaar)  Meloxicam (Mobic) ==================================================================== For clinical consultation, please call [phone] ====================================================================    UDS interpretation: Compliant          Medication Assessment Form: Reviewed. Patient indicates being compliant with therapy Treatment compliance: Compliant Risk Assessment Profile: Aberrant behavior: See initial evaluations. None observed or detected today Comorbid factors increasing risk of overdose: See initial evaluation. No additional risks detected today  Opioid risk tool (ORT):  Opioid Risk  11/17/2018  Alcohol 0  Illegal Drugs 0  Rx Drugs 0  Alcohol 0  Illegal Drugs 0  Rx Drugs 0  Age between 16-45 years  0  History of Preadolescent Sexual Abuse -  Psychological Disease 0  Depression 0  Opioid Risk Tool Scoring 0  Opioid Risk Interpretation Low Risk    ORT Scoring interpretation table:  Score <3 = Low Risk for SUD  Score between 4-7 = Moderate Risk for SUD  Score >8 = High Risk for Opioid Abuse   Risk of substance use disorder (SUD): Low  Risk Mitigation Strategies:  Patient Counseling: Covered Patient-Prescriber Agreement (PPA): Present and active  Notification to other healthcare providers: Done  Pharmacologic Plan: No change in  therapy, at this time.             Laboratory Chemistry   SAFETY SCREENING Profile No results found for: SARSCOV2NAA, COVIDSOURCE, STAPHAUREUS, MRSAPCR, HCVAB, HIV, PREGTESTUR Inflammation Markers (CRP: Acute Phase) (ESR: Chronic Phase) No results found for: CRP, ESRSEDRATE, LATICACIDVEN                       Rheumatology Markers No results found for: RF, ANA, LABURIC, URICUR, LYMEIGGIGMAB, LYMEABIGMQN, HLAB27                      Renal Function Markers Lab Results  Component Value Date   BUN 17 04/08/2017   CREATININE 1.52 (H) 11/09/2018   GFRAA >60 11/09/2018   GFRNONAA 53 (L) 11/09/2018                             Hepatic Function Markers No results found for: AST, ALT, ALBUMIN, ALKPHOS, HCVAB, AMYLASE, LIPASE, AMMONIA                      Electrolytes Lab Results  Component Value Date   NA 130 (L) 04/08/2017   K 3.5 04/08/2017   CL 96 (L) 04/08/2017   CALCIUM 9.2 04/08/2017                        Coagulation Parameters Lab Results  Component Value Date   PLT 272 04/08/2017                        Cardiovascular Markers Lab Results  Component Value Date   TROPONINI <0.03 04/08/2017   HGB 14.2 04/08/2017   HCT 42.2 04/08/2017                          Note: Lab results reviewed.  Recent Diagnostic Imaging Results  IR Radiologist Eval & Mgmt Please refer to notes tab for details about interventional procedure. (Op  Note)  Complexity Note: Imaging results reviewed. Results shared with Jeremy Conley, using Layman's terms.                               Meds   Current Outpatient Medications:  .  aspirin EC 81 MG tablet, Take 81 mg by mouth daily., Disp: , Rfl:  .  atorvastatin (LIPITOR) 20 MG tablet, Take 20 mg by mouth daily., Disp: , Rfl:  .  clindamycin (CLEOCIN T) 1 % external solution, Apply topically to scalp and face daily., Disp: ,  Rfl:  .  hydrochlorothiazide (HYDRODIURIL) 25 MG tablet, Take 25 mg by mouth daily., Disp: , Rfl:  .   HYDROcodone-acetaminophen (NORCO) 7.5-325 MG tablet, Take 1-2 tablets by mouth daily., Disp: 45 tablet, Rfl: 0 .  [START ON 12/17/2018] HYDROcodone-acetaminophen (NORCO) 7.5-325 MG tablet, Take 1-2 tablets by mouth daily., Disp: 45 tablet, Rfl: 0 .  [START ON 01/16/2019] HYDROcodone-acetaminophen (NORCO) 7.5-325 MG tablet, Take 1-2 tablets by mouth daily., Disp: 45 tablet, Rfl: 0 .  losartan (COZAAR) 100 MG tablet, Take 100 mg by mouth daily., Disp: , Rfl:  .  meloxicam (MOBIC) 15 MG tablet, Take 15 mg by mouth daily., Disp: , Rfl:  .  omeprazole (PRILOSEC) 20 MG capsule, Take 20 mg by mouth daily., Disp: , Rfl:  .  tadalafil (CIALIS) 5 MG tablet, Take 1 tablet (5 mg total) by mouth daily as needed for erectile dysfunction., Disp: 30 tablet, Rfl: 0 .  triamcinolone cream (KENALOG) 0.1 %, Apply to rash on arm 1-2 times daily as needed., Disp: , Rfl:  .  atorvastatin (LIPITOR) 20 MG tablet, Take 20 mg by mouth., Disp: , Rfl:  .  ergocalciferol (VITAMIN D2) 50000 units capsule, Take 50,000 Units by mouth once a week., Disp: , Rfl:  .  meloxicam (MOBIC) 7.5 MG tablet, Take 7.5 mg by mouth., Disp: , Rfl:  .  Omeprazole 20 MG TBEC, Take by mouth., Disp: , Rfl:   ROS  Constitutional: Denies any fever or chills Gastrointestinal: No reported hemesis, hematochezia, vomiting, or acute GI distress Musculoskeletal: Denies any acute onset joint swelling, redness, loss of ROM, or weakness Neurological: No reported episodes of acute onset apraxia, aphasia, dysarthria, agnosia, amnesia, paralysis, loss of coordination, or loss of consciousness  Allergies  Jeremy Conley has No Known Allergies.  PFSH  Drug: Jeremy Conley  reports no history of drug use. Alcohol:  reports current alcohol use. Tobacco:  reports that he has been smoking cigars. He has smoked for the past 15.00 years. He has never used smokeless tobacco. Medical:  has a past medical history of Arthritis, Arthritis, Depression, GERD (gastroesophageal  reflux disease), Hyperlipidemia, Hypertension, and Kidney mass. Surgical: Jeremy Conley  has a past surgical history that includes Knee arthroscopy (Left, 1993 and 1997); IR Radiologist Eval & Mgmt (11/19/2017); IR Radiologist Eval & Mgmt (03/24/2018); and IR Radiologist Eval & Mgmt (11/12/2018). Family: family history includes Heart disease in his father.  Constitutional Exam  General appearance: Well nourished, well developed, and well hydrated. In no apparent acute distress Vitals:   11/17/18 0947 11/17/18 0956  BP: (!) 154/106 (!) 149/98  Pulse: 99   Resp: 16   Temp: 98.6 F (37 C)   TempSrc: Oral   SpO2: 96%   Weight: (!) 345 lb (156.5 kg)   Height: 6' (1.829 m)    BMI Assessment: Estimated body mass index is 46.79 kg/m as calculated from the following:   Height as of this encounter: 6' (1.829 m).   Weight as of this encounter: 345 lb (156.5 kg).  BMI interpretation table: BMI level Category Range association with higher incidence of chronic pain  <18 kg/m2 Underweight   18.5-24.9 kg/m2 Ideal body weight   25-29.9 kg/m2 Overweight Increased incidence by 20%  30-34.9 kg/m2 Obese (Class I) Increased incidence by 68%  35-39.9 kg/m2 Severe obesity (Class II) Increased incidence by 136%  >40 kg/m2 Extreme obesity (Class III) Increased incidence by 254%   Patient's current BMI Ideal Body weight  Body mass index is 46.79 kg/m. Ideal  body weight: 77.6 kg (171 lb 1.2 oz) Adjusted ideal body weight: 109.2 kg (240 lb 10.3 oz)   BMI Readings from Last 4 Encounters:  11/17/18 46.79 kg/m  08/13/18 46.79 kg/m  07/16/18 46.79 kg/m  06/23/18 46.79 kg/m   Wt Readings from Last 4 Encounters:  11/17/18 (!) 345 lb (156.5 kg)  08/13/18 (!) 345 lb (156.5 kg)  07/16/18 (!) 345 lb (156.5 kg)  06/23/18 (!) 345 lb (156.5 kg)  Psych/Mental status: Alert, oriented x 3 (person, place, & time)       Eyes: PERLA Respiratory: No evidence of acute respiratory distress Cervical Spine Area Exam   Skin & Axial Inspection: No masses, redness, edema, swelling, or associated skin lesions Alignment: Symmetrical Functional ROM: Unrestricted ROM      Stability: No instability detected Muscle Tone/Strength: Functionally intact. No obvious neuro-muscular anomalies detected. Sensory (Neurological): Unimpaired Palpation: No palpable anomalies                    Upper Extremity (UE) Exam    Side: Right upper extremity  Side: Left upper extremity   Skin & Extremity Inspection: Skin color, temperature, and hair growth are WNL. No peripheral edema or cyanosis. No masses, redness, swelling, asymmetry, or associated skin lesions. No contractures.  Skin & Extremity Inspection: Skin color, temperature, and hair growth are WNL. No peripheral edema or cyanosis. No masses, redness, swelling, asymmetry, or associated skin lesions. No contractures.   Functional ROM: Unrestricted ROM          Functional ROM: Unrestricted ROM           Muscle Tone/Strength: Functionally intact. No obvious neuro-muscular anomalies detected.  Muscle Tone/Strength: Functionally intact. No obvious neuro-muscular anomalies detected.   Sensory (Neurological): Unimpaired          Sensory (Neurological): Unimpaired           Palpation: No palpable anomalies              Palpation: No palpable anomalies               Provocative Test(s):  Phalen's test: deferred Tinel's test: deferred Apley's scratch test (touch opposite shoulder):  Action 1 (Across chest): deferred Action 2 (Overhead): deferred Action 3 (LB reach): deferred   Provocative Test(s):  Phalen's test: deferred Tinel's test: deferred Apley's scratch test (touch opposite shoulder):  Action 1 (Across chest): deferred Action 2 (Overhead): deferred Action 3 (LB reach): deferred     Thoracic Spine Area Exam  Skin & Axial Inspection: No masses, redness, or swelling Alignment: Symmetrical Functional ROM: Unrestricted ROM Stability: No instability  detected Muscle Tone/Strength: Functionally intact. No obvious neuro-muscular anomalies detected. Sensory (Neurological): Unimpaired Muscle strength & Tone: No palpable anomalies  Lumbar Spine Area Exam  Skin & Axial Inspection: No masses, redness, or swelling Alignment: Symmetrical Functional ROM: Decreased ROM       Stability: No instability detected Muscle Tone/Strength: Functionally intact. No obvious neuro-muscular anomalies detected. Sensory (Neurological): Unimpaired Palpation: No palpable anomalies       Provocative Tests: Hyperextension/rotation test: deferred today       Lumbar quadrant test (Kemp's test): deferred today       Lateral bending test: deferred today       Patrick's Maneuver: deferred today                   FABER* test: deferred today  S-I anterior distraction/compression test: deferred today         S-I lateral compression test: deferred today         S-I Thigh-thrust test: deferred today         S-I Gaenslen's test: deferred today         *(Flexion, ABduction and External Rotation)  Gait & Posture Assessment  Ambulation: Patient ambulates using a cane Gait: Limited. Using assistive device to ambulate Posture: Difficulty standing up straight, due to pain   Lower Extremity Exam    Side: Right lower extremity  Side: Left lower extremity  Stability: No instability observed          Stability: No instability observed          Skin & Extremity Inspection: Skin color, temperature, and hair growth are WNL. No peripheral edema or cyanosis. No masses, redness, swelling, asymmetry, or associated skin lesions. No contractures.  Skin & Extremity Inspection: Skin color, temperature, and hair growth are WNL. No peripheral edema or cyanosis. No masses, redness, swelling, asymmetry, or associated skin lesions. No contractures.  Functional ROM: Pain restricted ROM for knee joint          Functional ROM: Pain restricted ROM for knee joint           Muscle Tone/Strength: Functionally intact. No obvious neuro-muscular anomalies detected.  Muscle Tone/Strength: Functionally intact. No obvious neuro-muscular anomalies detected.  Sensory (Neurological): Arthropathic arthralgia        Sensory (Neurological): Arthropathic arthralgia        DTR: Patellar: 1+: trace Achilles: deferred today Plantar: deferred today  DTR: Patellar: 1+: trace Achilles: deferred today Plantar: deferred today  Palpation: No palpable anomalies  Palpation: No palpable anomalies    Assessment   Status Diagnosis  Controlled Controlled Controlled 1. Chronic pain syndrome   2. Primary osteoarthritis of right knee   3. Primary osteoarthritis of left knee   4. Morbid obesity ([place])   5. Chronic pain of both knees   6. Essential hypertension      General Recommendations: The pain condition that the patient suffers from is best treated with a multidisciplinary approach that involves an increase in physical activity to prevent de-conditioning and worsening of the pain cycle, as well as psychological counseling (formal and/or informal) to address the co-morbid psychological affects of pain. Treatment will often involve judicious use of pain medications and interventional procedures to decrease the pain, allowing the patient to participate in the physical activity that will ultimately produce long-lasting pain reductions. The goal of the multidisciplinary approach is to return the patient to a higher level of overall function and to restore their ability to perform activities of daily living.    Plan of Care  Pharmacotherapy (Medications Ordered): Meds ordered this encounter  Medications  . HYDROcodone-acetaminophen (NORCO) 7.5-325 MG tablet    Sig: Take 1-2 tablets by mouth daily.    Dispense:  45 tablet    Refill:  0  . HYDROcodone-acetaminophen (NORCO) 7.5-325 MG tablet    Sig: Take 1-2 tablets by mouth daily.    Dispense:  45 tablet    Refill:  0  .  HYDROcodone-acetaminophen (NORCO) 7.5-325 MG tablet    Sig: Take 1-2 tablets by mouth daily.    Dispense:  45 tablet    Refill:  0   Medications administered today: Dicie Beam. Ciszewski had no medications administered during this visit.  Orders:  Orders Placed This Encounter  Procedures  . Drug Screen 10 W/Conf, Serum  Lab Orders     Drug Screen 10 W/Conf, Serum Planned follow-up:   Return in about 3 months (around 02/17/2019) for Medication Management.   Recent Visits No visits were found meeting these conditions.  Showing recent visits within past 90 days and meeting all other requirements   Today's Visits Date Type Provider Dept  11/17/18 Office Visit Gillis Santa, MD [facility]  Showing today's visits and meeting all other requirements   Future Appointments Date Type Provider Dept  02/11/19 Appointment Gillis Santa, MD [facility]  Showing future appointments within next 90 days and meeting all other requirements   Primary Care Physician: [facility] [place] [facility] Location: [facility] Note by: Gillis Santa, MD Date: 11/17/2018; Time: 10:55 AM  Note: This dictation was prepared with Dragon dictation. Any transcriptional errors that may result from this process are unintentional.

## 2018-11-17 NOTE — Progress Notes (Signed)
Nursing Pain Medication Assessment:  Safety precautions to be maintained throughout the outpatient stay will include: orient to surroundings, keep bed in low position, maintain call bell within reach at all times, provide assistance with transfer out of bed and ambulation.  Medication Inspection Compliance: Pill count conducted under aseptic conditions, in front of the patient. Neither the pills nor the bottle was removed from the patient's sight at any time. Once count was completed pills were immediately returned to the patient in their original bottle.  Medication: Hydrocodone/APAP Pill/Patch Count: 0 of 45 pills remain Pill/Patch Appearance: Markings consistent with prescribed medication Bottle Appearance: Standard pharmacy container. Clearly labeled. Filled Date: 05 / 25 / 2020 Last Medication intake:  Ran out of medicine more than 48 hours ago

## 2018-11-25 LAB — DRUG SCREEN 10 W/CONF, SERUM
Amphetamines, IA: NEGATIVE ng/mL
Barbiturates, IA: NEGATIVE ug/mL
Benzodiazepines, IA: NEGATIVE ng/mL
Cocaine & Metabolite, IA: NEGATIVE ng/mL
Methadone, IA: NEGATIVE ng/mL
Opiates, IA: POSITIVE ng/mL — AB
Oxycodones, IA: NEGATIVE ng/mL
Phencyclidine, IA: NEGATIVE ng/mL
Propoxyphene, IA: NEGATIVE ng/mL
THC(Marijuana) Metabolite, IA: NEGATIVE ng/mL

## 2018-11-25 LAB — OPIATES,MS,WB/SP RFX
6-Acetylmorphine: NEGATIVE
Codeine: NEGATIVE ng/mL
Dihydrocodeine: 1.4 ng/mL
Hydrocodone: 11.5 ng/mL
Hydromorphone: NEGATIVE ng/mL
Morphine: NEGATIVE ng/mL
Opiate Confirmation: POSITIVE

## 2018-11-25 LAB — OXYCODONES,MS,WB/SP RFX
Oxycocone: NEGATIVE ng/mL
Oxycodones Confirmation: NEGATIVE
Oxymorphone: NEGATIVE ng/mL

## 2019-01-07 IMAGING — MR MR ABDOMEN WO/W CM
10 of 17 series · 20 of 48 positions shown · IV contrast (20 MH)
Comparison: We are happy to provide direct comparison to the exam
which prompted today's study if it can be provided.

CLINICAL DATA: Cystic lesions of the left kidney including a
potential enhancing lesion. For further workup.

EXAM:
MRI ABDOMEN WITHOUT AND WITH CONTRAST
TECHNIQUE: Multiplanar multisequence MR imaging of the abdomen was performed
both before and after the administration of intravenous contrast.
CONTRAST:  20mL MULTIHANCE GADOBENATE DIMEGLUMINE 529 MG/ML IV SOLN

[Series 4: cor ssfse nav · coronal · 6.0mm · 0.94mm/px · 1 of 31 slices shown]
[im 1/31]
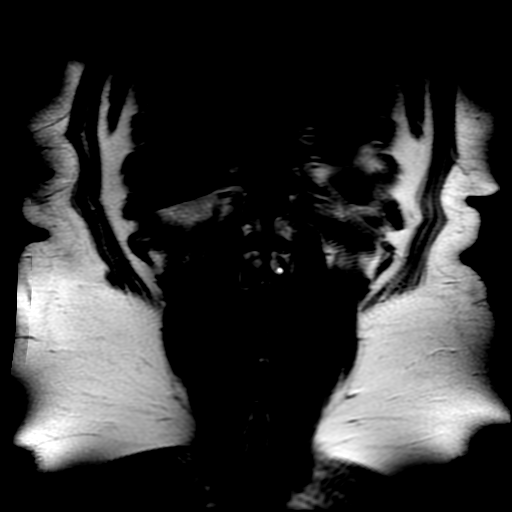

[Series 6: ax ssfse nav · axial · 6.0mm · 0.82mm/px · 1 of 46 slices shown]
[im 1/46]
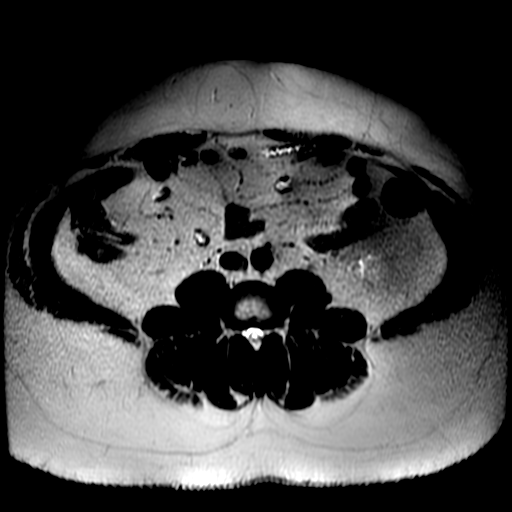

[Series 7: T2 fat-sat · axial · 6.0mm · 0.82mm/px · 1 of 46 slices shown]
[im 1/46]
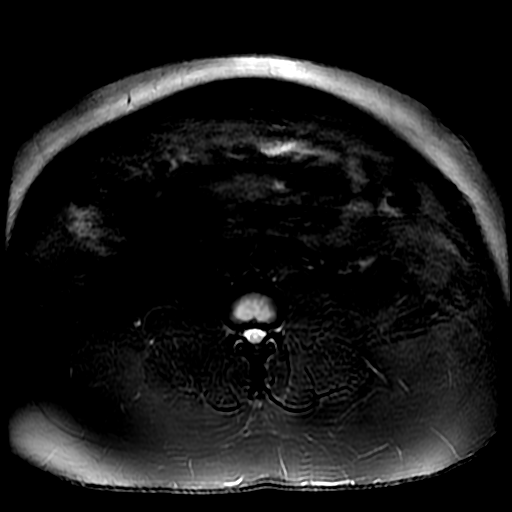

[Series 8: DWI b500 · axial · 8.0mm · 1.64mm/px · z∈[-61,+209]mm · 2 of 56 slices shown]
[im 1/56]
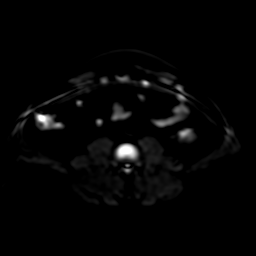
[im 56/56]
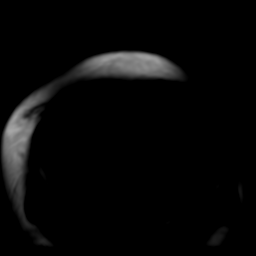

[Series 14: T1 dynamic post-contrast · coronal · 3.8mm · 0.94mm/px · 2 of 60 slices shown (1 of 3)]
[im 1/60]
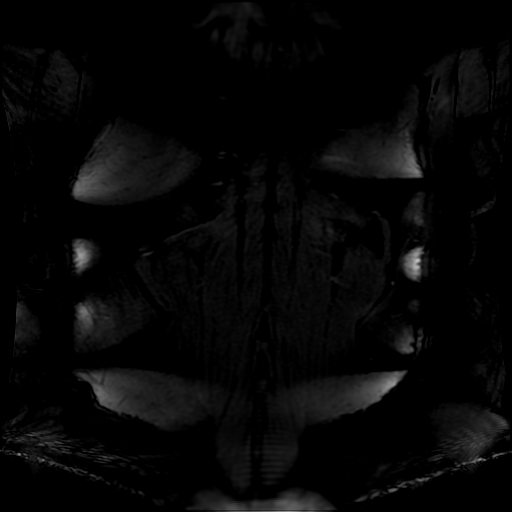
[im 60/60]
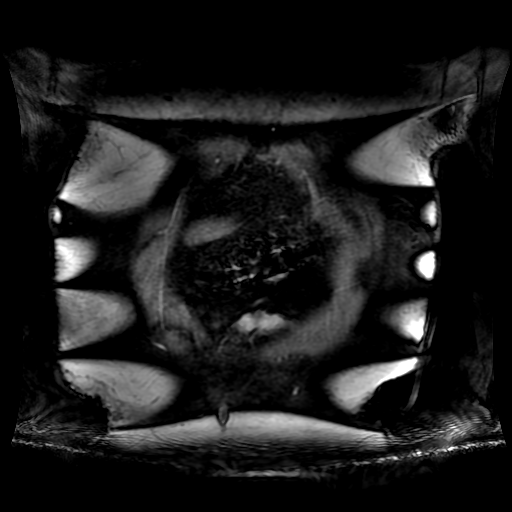

[Series 850: ADC · axial · 8.0mm · 1.64mm/px · 1 of 28 slices shown]
[im 1/28]
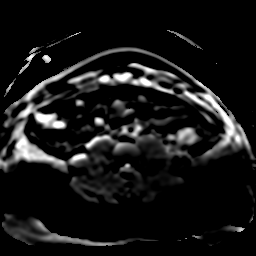

[Series 1101: T1 dynamic · axial · 3.8mm · 0.82mm/px · z∈[-108,+177]mm · 2 of 76 slices shown (1 of 2)]
[im 1/76]
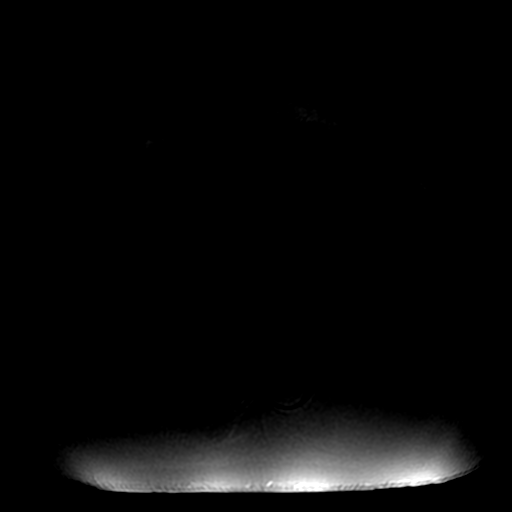
[im 76/76]
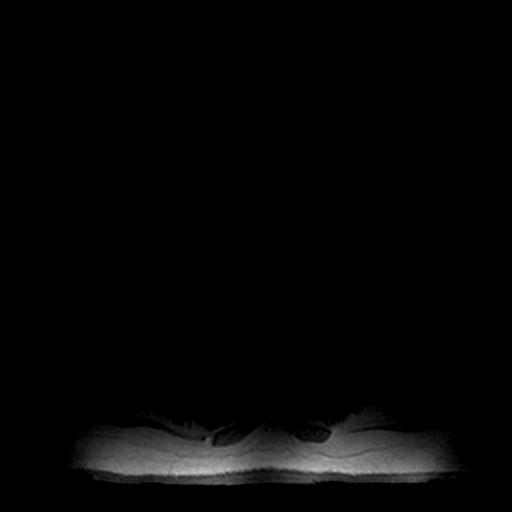

[Series 1102: T1 dynamic · axial · 3.8mm · 0.82mm/px · z∈[-108,+177]mm · 2 of 76 slices shown (2 of 2)]
[im 1/76]
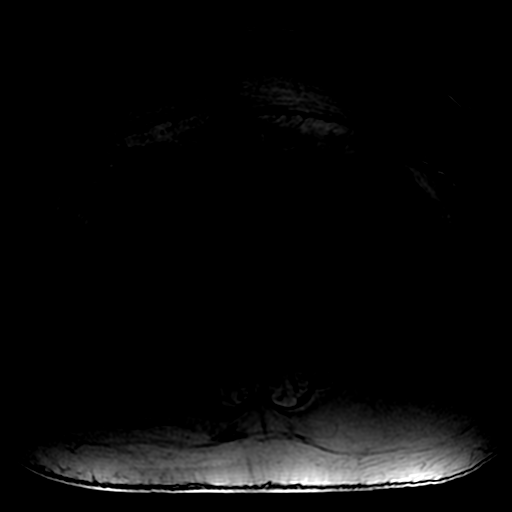
[im 76/76]
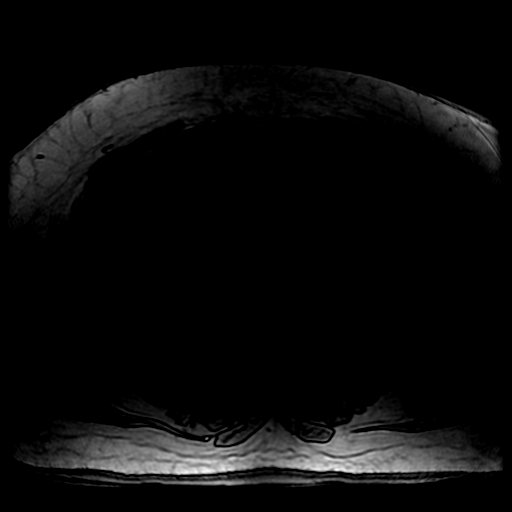

[Series 1300: T1 dynamic post-contrast · axial · non-contrast · 4.0mm · 0.82mm/px · z∈[-113,+181]mm · 4 of 148 slices shown (2 of 3)]
[im 1/148]
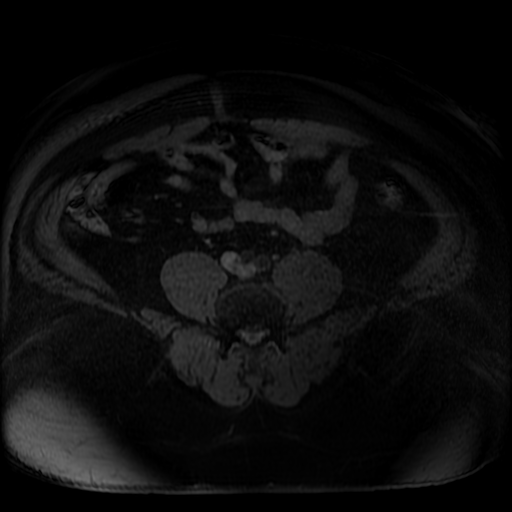
[im 50/148]
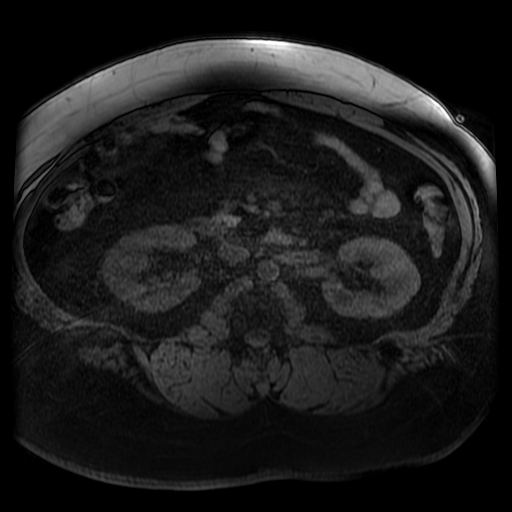
[im 99/148]
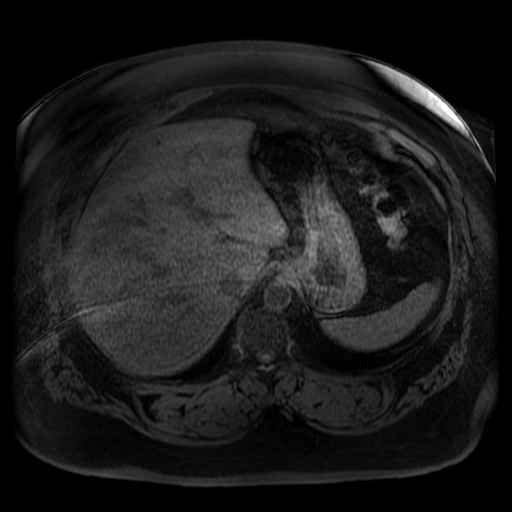
[im 148/148]
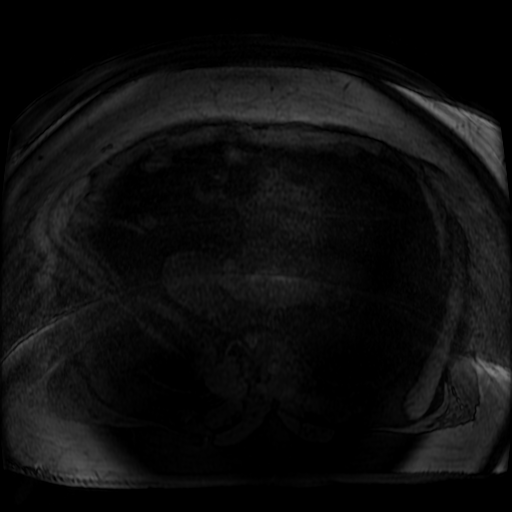

[Series 1301: T1 dynamic post-contrast · axial · non-contrast · 4.0mm · 0.82mm/px · z∈[-113,+181]mm · 4 of 148 slices shown (3 of 3)]
[im 1/148]
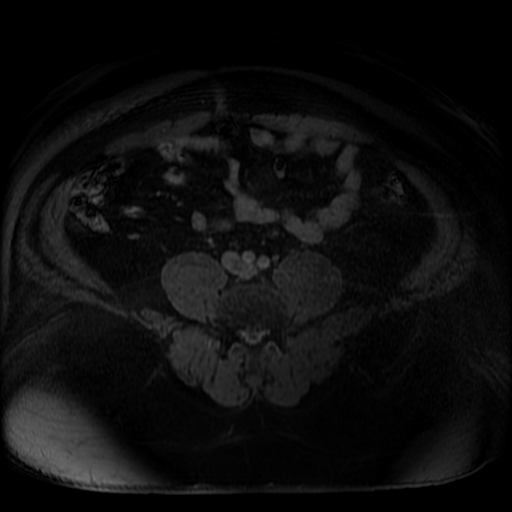
[im 50/148]
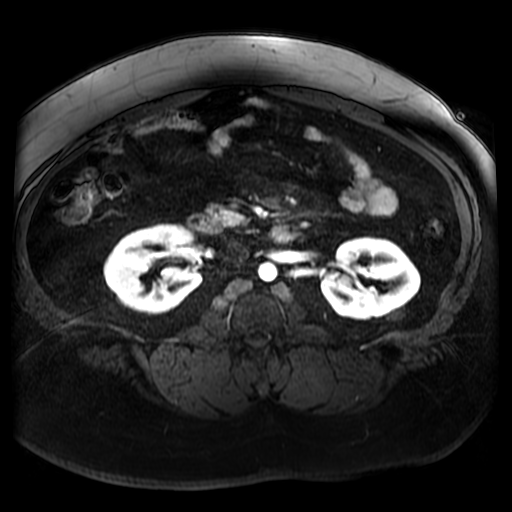
[im 99/148]
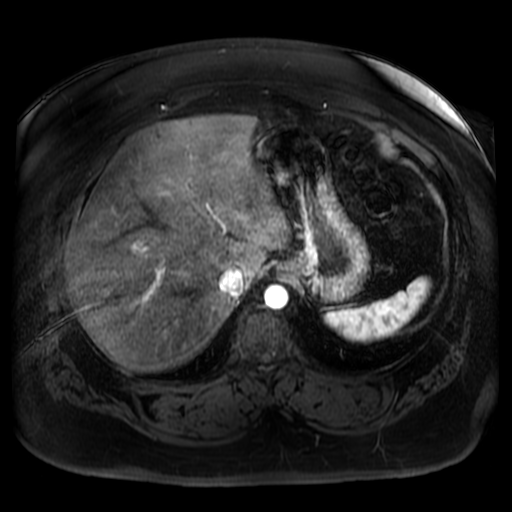
[im 148/148]
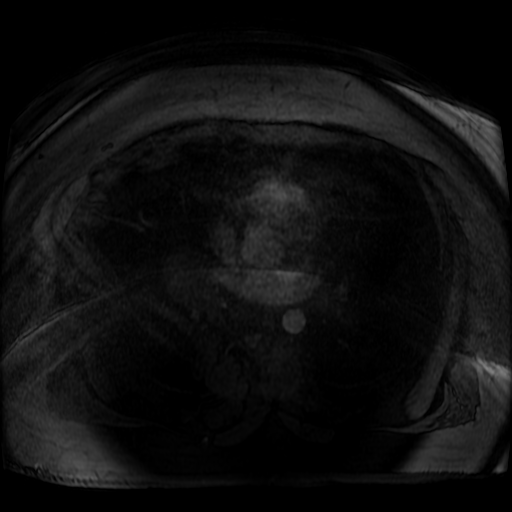

[20 of 48 positions shown; findings below may reference images not displayed]

FINDINGS: Body habitus reduces diagnostic sensitivity and specificity.

Lower chest: Unremarkable

Hepatobiliary: Diffuse hepatic steatosis.  Unremarkable

Pancreas:  Unremarkable

Spleen:  Unremarkable

Adrenals/Urinary Tract:  Adrenal glands normal.

The presume lesion of concern along the left kidney lower pole
posteriorly has low precontrast T2 signal characteristics and
measures 1.0 by 0.9 cm on image 39/6. This lesion has intermediate
to low precontrast T1 signal characteristics and at least some
degree of nodular internal enhancement for example on image 119/4560
and particularly on image 18/14, overall appearance favoring a small
papillary renal cell carcinoma.

1.6 by 1.3 cm left mid kidney lesion with high T2 and high T1 signal
characteristics and no internal enhancement, compatible with a
Bosniak category 2 cyst, shown on image [DATE].

1.0 by 0.8 cm lesion with high T2 and intermediate T1 signal
characteristics as shown on image 35/6 demonstrates no internal
enhancement, compatible with a Bosniak category 2 cyst.

Approximately 10 mm lesion with low T1 and high T2 signal
characteristics in the upper pole the right kidney on image [DATE]
demonstrates no significant appreciable enhancement, favoring
Bosniak category 1 cyst.

Stomach/Bowel: Unremarkable

Vascular/Lymphatic:  Unremarkable

Other: Subtle accentuated signal on postcontrast images in the
central mesentery is nonspecific but could be from low-grade
mesenteric panniculitis. I do not see a discrete mass like
appearance.

Musculoskeletal: Unremarkable
IMPRESSION: 1. 1.0 by 0.9 cm lesion posteriorly along the left kidney lower pole
has imaging characteristics most typical for a small papillary renal
cell carcinoma. No adenopathy or tumor thrombus in the left renal
vein.
2. Two other Bosniak category 2 cysts of the left kidney appear
benign.
3. Subtle accentuated signal in the central mesentery could reflect
low-grade mesenteric panniculitis. No discrete mass like appearance
in this vicinity.
4. Diffuse hepatic steatosis.

## 2019-01-18 ENCOUNTER — Telehealth: Payer: Self-pay | Admitting: Urology

## 2019-01-18 MED ORDER — TADALAFIL 5 MG PO TABS: 5.0000 mg | ORAL_TABLET | Freq: Every day | ORAL | 0 refills | Status: DC | PRN

## 2019-01-18 NOTE — Telephone Encounter (Signed)
Pt needs refill for Cialis. Sent to [facility].

## 2019-01-18 NOTE — Telephone Encounter (Signed)
Patient advised.

## 2019-02-10 ENCOUNTER — Encounter: Payer: Self-pay | Admitting: Student in an Organized Health Care Education/Training Program

## 2019-02-11 ENCOUNTER — Encounter: Payer: Self-pay | Admitting: Student in an Organized Health Care Education/Training Program

## 2019-02-11 ENCOUNTER — Ambulatory Visit
Payer: Medicaid Other | Attending: Student in an Organized Health Care Education/Training Program | Admitting: Student in an Organized Health Care Education/Training Program

## 2019-02-11 ENCOUNTER — Other Ambulatory Visit: Payer: Self-pay

## 2019-02-11 DIAGNOSIS — M1712 Unilateral primary osteoarthritis, left knee: Secondary | ICD-10-CM | POA: Diagnosis not present

## 2019-02-11 DIAGNOSIS — M1711 Unilateral primary osteoarthritis, right knee: Secondary | ICD-10-CM | POA: Diagnosis not present

## 2019-02-11 DIAGNOSIS — M25561 Pain in right knee: Secondary | ICD-10-CM

## 2019-02-11 DIAGNOSIS — I10 Essential (primary) hypertension: Secondary | ICD-10-CM

## 2019-02-11 DIAGNOSIS — M25562 Pain in left knee: Secondary | ICD-10-CM

## 2019-02-11 DIAGNOSIS — G894 Chronic pain syndrome: Secondary | ICD-10-CM | POA: Diagnosis not present

## 2019-02-11 DIAGNOSIS — G8929 Other chronic pain: Secondary | ICD-10-CM

## 2019-02-11 MED ORDER — HYDROCODONE-ACETAMINOPHEN 7.5-325 MG PO TABS: 1.0000 | ORAL_TABLET | Freq: Every day | ORAL | 0 refills | Status: DC

## 2019-02-11 NOTE — Progress Notes (Signed)
Pain Management Virtual Encounter Note - Virtual Visit via [place] (real-time audio visits between healthcare provider and patient).   Patient's Phone No. & Preferred Pharmacy:  [phone] (home); [phone] (mobile); (Preferred) [phone] [email or web address]  [place] (N), [place]) [place] [address] Phone: [phone] Fax: [place], [place] [address] [place] [address] Phone: [phone] Fax: [phone]    Pre-screening note:  Our staff contacted Jeremy Conley and offered Jeremy Conley an "in person", "face-to-face" appointment versus a telephone encounter. He indicated preferring the telephone encounter, at this time.   Reason for Virtual Visit: COVID-19*  Social distancing based on CDC and AMA recommendations.   I contacted [name] on 02/11/2019 via video conference.      I clearly identified myself as Gillis Santa, MD. I verified that I was speaking with the correct person using two identifiers (Name: Jeremy Conley, and date of birth: September 10, 1970).  Advanced Informed Consent I sought verbal advanced consent from [name] for virtual visit interactions. I informed Jeremy Conley of possible security and privacy concerns, risks, and limitations associated with providing "not-in-person" medical evaluation and management services. I also informed Jeremy Conley of the availability of "in-person" appointments. Finally, I informed Jeremy Conley that there would be a charge for the virtual visit and that he could be  personally, fully or partially, financially responsible for it. Jeremy Conley expressed understanding and agreed to proceed.   Historic Elements   Jeremy Conley is a 48 y.o. year old, male patient evaluated today after his last encounter by our practice on 11/17/2018. Jeremy Conley  has a past  medical history of Arthritis, Arthritis, Depression, GERD (gastroesophageal reflux disease), Hyperlipidemia, Hypertension, and Kidney mass. He also  has a past surgical history that includes Knee arthroscopy (Left, 1993 and 1997); IR Radiologist Eval & Mgmt (11/19/2017); IR Radiologist Eval & Mgmt (03/24/2018); and IR Radiologist Eval & Mgmt (11/12/2018). Jeremy Conley has a current medication list which includes the following prescription(s): aspirin ec, atorvastatin, clindamycin, hydrochlorothiazide, hydrocodone-acetaminophen, hydrocodone-acetaminophen, hydrocodone-acetaminophen, losartan, meloxicam, omeprazole, tadalafil, triamcinolone cream, cholecalciferol, atorvastatin, ergocalciferol, meloxicam, and omeprazole. He  reports that he has been smoking cigars. He has smoked for the past 15.00 years. He has never used smokeless tobacco. He reports current alcohol use. He reports that he does not use drugs. Jeremy Conley has No Known Allergies.   HPI  Today, he is being contacted for medication management.   Diagnosed with sleep apnea, awaiting CPAP.  Patient's pain is at baseline.  Patient continues multimodal pain regimen as prescribed.  States that it provides pain relief and improvement in functional status.  Pharmacotherapy Assessment  Analgesic:  01/16/2019  1   11/17/2018  Hydrocodone-Acetamin 7.5-325  45.00  23 Bi Lat   [id number]   Wal (4231)   0  14.67 MME  Medicaid   [place]    Monitoring: Pharmacotherapy: No side-effects or adverse reactions reported. [place] PMP: PDMP reviewed during this encounter.       Compliance: No problems identified. Effectiveness: Clinically acceptable. Plan: Refer to "POC".  UDS:  Summary  Date Value Ref Range Status  12/10/2016 FINAL  Final    Comment:    ==================================================================== TOXASSURE COMP DRUG ANALYSIS,UR ==================================================================== Test  Result       Flag        Units Drug Absent but Declared for Prescription Verification   Hydrocodone                    Not Detected UNEXPECTED ng/mg creat   Gabapentin                     Not Detected UNEXPECTED   Acetaminophen                  Not Detected UNEXPECTED    Acetaminophen, as indicated in the declared medication list, is    not always detected even when used as directed. ==================================================================== Test                      Result    Flag   Units      Ref Range   Creatinine              343              mg/dL      >=20 ==================================================================== Declared Medications:  The flagging and interpretation on this report are based on the  following declared medications.  Unexpected results may arise from  inaccuracies in the declared medications.  **Note: The testing scope of this panel includes these medications:  Gabapentin (Neurontin)  Hydrocodone (Norco)  Hydrocodone (Vicodin)  **Note: The testing scope of this panel does not include small to  moderate amounts of these reported medications:  Acetaminophen (Norco)  Acetaminophen (Vicodin)  **Note: The testing scope of this panel does not include following  reported medications:  Hydrochlorothiazide (Hydrodiuril)  Losartan (Cozaar)  Meloxicam (Mobic) ==================================================================== For clinical consultation, please call [phone]. ====================================================================    Other Results from 11/17/2018  Contains abnormal data Drug Screen 10 W/Conf, Serum Order: [id number]  Status:  Final result Visible to patient:  Yes (MyChart) Next appt:  None Dx:  Chronic pain syndrome  Ref Range & Units 71mo ago  Amphetamines, IA Cutoff:50 ng/mL Negative   Barbiturates, IA Cutoff:0.1 ug/mL Negative   Benzodiazepines, IA Cutoff:20 ng/mL Negative   Cocaine & Metabolite, IA Cutoff:25 ng/mL Negative    Phencyclidine, IA Cutoff:8 ng/mL Negative   THC(Marijuana) Metabolite, IA Cutoff:5 ng/mL Negative   Opiates, IA Cutoff:5 ng/mL ++POSITIVE++Abnormal    Oxycodones, IA Cutoff:5 ng/mL Negative   Comment: Presumptive immunoassay result indicated need for further  testing; definitive confirmation was negative.   Methadone, IA Cutoff:25 ng/mL Negative   Propoxyphene, IA Cutoff:50 ng/mL Negative   Comment: This test was developed and its performance characteristics  determined by [facility]. It has not been cleared or approved          Laboratory Chemistry Profile (12 mo)  Renal: 11/09/2018: Creatinine, Ser 1.52  Lab Results  Component Value Date   GFRAA >60 11/09/2018   GFRNONAA 53 (L) 11/09/2018   Hepatic: No results found for requested labs within last 8760 hours. No results found for: AST, ALT Other: No results found for requested labs within last 8760 hours. Note: Above Lab results reviewed.   Assessment  The primary encounter diagnosis was Chronic pain syndrome. Diagnoses of Primary osteoarthritis of right knee, Primary osteoarthritis of left knee, Morbid obesity ([place]), Chronic pain of both knees, and Essential hypertension were also pertinent to this visit.  Plan of Care  I am having Jeremy Conley start on HYDROcodone-acetaminophen and HYDROcodone-acetaminophen. I am also having Jeremy Conley maintain  his atorvastatin, meloxicam, clindamycin, triamcinolone cream, aspirin EC, Omeprazole, ergocalciferol, hydrochlorothiazide, losartan, atorvastatin, meloxicam, omeprazole, tadalafil, (VITAMIN D, CHOLECALCIFEROL, PO), and HYDROcodone-acetaminophen.  Pharmacotherapy (Medications Ordered): Meds ordered this encounter  Medications  . HYDROcodone-acetaminophen (NORCO) 7.5-325 MG tablet    Sig: Take 1-2 tablets by mouth daily.    Dispense:  45 tablet    Refill:  0  . HYDROcodone-acetaminophen (NORCO) 7.5-325 MG tablet    Sig: Take 1-2 tablets by mouth daily.    Dispense:  45 tablet     Refill:  0  . HYDROcodone-acetaminophen (NORCO) 7.5-325 MG tablet    Sig: Take 1-2 tablets by mouth daily.    Dispense:  45 tablet    Refill:  0    Follow-up plan:   Return in about 3 months (around 05/13/2019) for Medication Management.    Recent Visits Date Type Provider Dept  11/17/18 Office Visit Gillis Santa, MD [facility]  Showing recent visits within past 90 days and meeting all other requirements   Today's Visits Date Type Provider Dept  02/11/19 Office Visit Gillis Santa, MD [facility]  Showing today's visits and meeting all other requirements   Future Appointments No visits were found meeting these conditions.  Showing future appointments within next 90 days and meeting all other requirements   I discussed the assessment and treatment plan with the patient. The patient was provided an opportunity to ask questions and all were answered. The patient agreed with the plan and demonstrated an understanding of the instructions.  Patient advised to call back or seek an in-person evaluation if the symptoms or condition worsens.  Total duration of non-face-to-face encounter: 25 minutes.  Note by: Gillis Santa, MD Date: 02/11/2019; Time: 9:46 AM  Note: This dictation was prepared with Dragon dictation. Any transcriptional errors that may result from this process are unintentional.  Disclaimer:  * Given the special circumstances of the COVID-19 pandemic, the federal government has announced that the Office for Civil Rights (OCR) will exercise its enforcement discretion and will not impose penalties on physicians using telehealth in the event of noncompliance with regulatory requirements under the [place] and [place] (HIPAA) in connection with the good faith provision of telehealth during the XX123456 national public health emergency. ([place])

## 2019-02-21 ENCOUNTER — Other Ambulatory Visit: Payer: Self-pay | Admitting: Urology

## 2019-04-08 ENCOUNTER — Other Ambulatory Visit: Payer: Self-pay | Admitting: Urology

## 2019-04-08 MED ORDER — TADALAFIL 5 MG PO TABS: ORAL_TABLET | ORAL | 3 refills | Status: AC

## 2019-04-14 ENCOUNTER — Telehealth: Payer: Self-pay | Admitting: Student in an Organized Health Care Education/Training Program

## 2019-04-14 NOTE — Telephone Encounter (Signed)
Patient called stating his pharmacy told him he needs prior authorization. On Hydrocodone. He is out of meds. Please expidite if possible.

## 2019-05-10 ENCOUNTER — Encounter: Payer: Self-pay | Admitting: Student in an Organized Health Care Education/Training Program

## 2019-05-11 ENCOUNTER — Encounter: Payer: Self-pay | Admitting: Student in an Organized Health Care Education/Training Program

## 2019-05-11 ENCOUNTER — Other Ambulatory Visit: Payer: Self-pay

## 2019-05-11 ENCOUNTER — Ambulatory Visit
Payer: Medicaid Other | Attending: Student in an Organized Health Care Education/Training Program | Admitting: Student in an Organized Health Care Education/Training Program

## 2019-05-11 DIAGNOSIS — G894 Chronic pain syndrome: Secondary | ICD-10-CM | POA: Diagnosis not present

## 2019-05-11 DIAGNOSIS — M25562 Pain in left knee: Secondary | ICD-10-CM

## 2019-05-11 DIAGNOSIS — M1712 Unilateral primary osteoarthritis, left knee: Secondary | ICD-10-CM

## 2019-05-11 DIAGNOSIS — M170 Bilateral primary osteoarthritis of knee: Secondary | ICD-10-CM

## 2019-05-11 DIAGNOSIS — I10 Essential (primary) hypertension: Secondary | ICD-10-CM

## 2019-05-11 DIAGNOSIS — M1711 Unilateral primary osteoarthritis, right knee: Secondary | ICD-10-CM

## 2019-05-11 DIAGNOSIS — G8929 Other chronic pain: Secondary | ICD-10-CM

## 2019-05-11 MED ORDER — HYDROCODONE-ACETAMINOPHEN 7.5-325 MG PO TABS: 1.0000 | ORAL_TABLET | Freq: Every day | ORAL | 0 refills | Status: DC

## 2019-05-11 NOTE — Progress Notes (Signed)
Virtual Encounter - Pain Management PROVIDER NOTE: Information contained herein reflects review and annotations entered in association with encounter. Patient information is provided elsewhere in the medical record. Interpretation of information and data should be left to medically trained personnel. Document created using STT technology, any transcriptional errors that may result from process are unintentional.    Contact & Pharmacy Preferred: [phone] Home: [phone] (home) Mobile: [phone] (mobile) E-mail: [email or web address]  [place] ([place]), [place]) [place] [address] Phone: [phone] Fax: [place], [place] [address] Phone: [phone] Fax: [phone]   Pre-screening  Jeremy Conley offered "in-person" vs "virtual" encounter. He indicated preferring virtual for this encounter.   Reason COVID-19*  Social distancing based on CDC and AMA recommendations.   I contacted BANKSTON MUZYKA on 05/11/2019 via telephone (video was attempted but connection poor).      I clearly identified myself as Gillis Santa, MD. I verified that I was speaking with the correct person using two identifiers (Name: Jeremy Conley, and date of birth: 1970-09-18).  Consent I sought verbal advanced consent from Jeremy Conley for virtual visit interactions. I informed Jeremy Conley of possible security and privacy concerns, risks, and limitations associated with providing "not-in-person" medical evaluation and management services. I also informed Jeremy Conley of the availability of "in-person" appointments. Finally, I informed him that there would be a charge for the virtual visit and that he could be  personally, fully or partially, financially responsible for it. Jeremy Conley expressed understanding and agreed to proceed.    Historic Elements   Jeremy Conley is a 48 y.o. year old, male patient evaluated today after his last encounter by our practice on 04/14/2019. Jeremy Conley  has a past medical history of Arthritis, Arthritis, Depression, GERD (gastroesophageal reflux disease), Hyperlipidemia, Hypertension, and Kidney mass. He also  has a past surgical history that includes Knee arthroscopy (Left, 1993 and 1997); IR Radiologist Eval & Mgmt (11/19/2017); IR Radiologist Eval & Mgmt (03/24/2018); and IR Radiologist Eval & Mgmt (11/12/2018). Jeremy Conley has a current medication list which includes the following prescription(s): aspirin ec, clindamycin, hydrochlorothiazide, [START ON 05/15/2019] hydrocodone-acetaminophen, [START ON 06/14/2019] hydrocodone-acetaminophen, [START ON 07/14/2019] hydrocodone-acetaminophen, losartan, meloxicam, meloxicam, omeprazole, omeprazole, tadalafil, triamcinolone cream, cholecalciferol, atorvastatin, and atorvastatin. He  reports that he has been smoking cigars. He has smoked for the past 15.00 years. He has never used smokeless tobacco. He reports current alcohol use. He reports that he does not use drugs. Jeremy Conley has No Known Allergies.   HPI  Today, he is being contacted for medication management.   No change in medical history since last visit.  Patient's pain is at baseline.  Patient continues multimodal pain regimen as prescribed.  States that it provides pain relief and improvement in functional status.  Pharmacotherapy Assessment  Analgesic:  04/16/2019  1   02/11/2019  Hydrocodone-Acetamin 7.5-325  45.00  23 Bi Lat   [id number]   Wal (4231)   0  14.67 MME  Medicaid   [place]    Monitoring: Pharmacotherapy: mild pruritis- discussed OTC Benadryl 25-50 mg qhs prn (sedative risk discussed)  PMP: PDMP reviewed during this encounter.       Compliance: No problems identified. Effectiveness: Clinically acceptable. Plan: Refer to "POC".  UDS:  Summary  Date Value Ref Range Status   12/10/2016 FINAL  Final  Comment:    ==================================================================== TOXASSURE COMP DRUG ANALYSIS,UR ==================================================================== Test                             Result       Flag       Units Drug Absent but Declared for Prescription Verification   Hydrocodone                    Not Detected UNEXPECTED ng/mg creat   Gabapentin                     Not Detected UNEXPECTED   Acetaminophen                  Not Detected UNEXPECTED    Acetaminophen, as indicated in the declared medication list, is    not always detected even when used as directed. ==================================================================== Test                      Result    Flag   Units      Ref Range   Creatinine              343              mg/dL      >=20 ==================================================================== Declared Medications:  The flagging and interpretation on this report are based on the  following declared medications.  Unexpected results may arise from  inaccuracies in the declared medications.  **Note: The testing scope of this panel includes these medications:  Gabapentin (Neurontin)  Hydrocodone (Norco)  Hydrocodone (Vicodin)  **Note: The testing scope of this panel does not include small to  moderate amounts of these reported medications:  Acetaminophen (Norco)  Acetaminophen (Vicodin)  **Note: The testing scope of this panel does not include following  reported medications:  Hydrochlorothiazide (Hydrodiuril)  Losartan (Cozaar)  Meloxicam (Mobic) ==================================================================== For clinical consultation, please call [phone]. ====================================================================     Drug Screen 10 W/Conf, Serum 10/2018  Status:  Final result Visible to patient:  Yes (MyChart) Next appt:  None Dx:  Chronic pain syndrome Order:  [id number]  Ref Range & Units 5 mo ago  Amphetamines, IA Cutoff:50 ng/mL Negative   Barbiturates, IA Cutoff:0.1 ug/mL Negative   Benzodiazepines, IA Cutoff:20 ng/mL Negative   Cocaine & Metabolite, IA Cutoff:25 ng/mL Negative   Phencyclidine, IA Cutoff:8 ng/mL Negative   THC(Marijuana) Metabolite, IA Cutoff:5 ng/mL Negative   Opiates, IA Cutoff:5 ng/mL ++POSITIVE++Abnormal    Oxycodones, IA Cutoff:5 ng/mL Negative   Comment: Presumptive immunoassay result indicated need for further  testing; definitive confirmation was negative.   Methadone, IA Cutoff:25 ng/mL Negative   Propoxyphene, IA Cutoff:50 ng/mL Negative   Comment: This test was developed and its performance characteristics  determined by [facility]. It has not been cleared or approved  by the Food and Drug Administration.          Laboratory Chemistry Profile (12 mo)  Renal: 11/09/2018: Creatinine, Ser 1.52  Lab Results  Component Value Date   GFRAA >60 11/09/2018   GFRNONAA 53 (L) 11/09/2018   Hepatic: No results found for requested labs within last 8760 hours. No results found for: AST, ALT Other: No results found for requested labs within last 8760 hours. Note: Above Lab results reviewed.  Imaging  IR Radiologist Eval & Mgmt Please refer to notes tab for details about interventional procedure. (Op  Note)  Assessment  The primary encounter diagnosis was Chronic pain syndrome. Diagnoses of Primary osteoarthritis of right knee, Primary osteoarthritis of left knee, Morbid obesity ([place]), Chronic pain of both knees, and Essential hypertension were also pertinent to this visit.  Plan of Care  I have discontinued Dicie Beam. [place]'s ergocalciferol. I have also changed his HYDROcodone-acetaminophen. Additionally, I am having him start on HYDROcodone-acetaminophen and HYDROcodone-acetaminophen. Lastly, I am having him maintain his atorvastatin, meloxicam, clindamycin, triamcinolone cream, aspirin EC, Omeprazole,  hydrochlorothiazide, losartan, atorvastatin, meloxicam, omeprazole, (VITAMIN D, CHOLECALCIFEROL, PO), and tadalafil.  Pharmacotherapy (Medications Ordered): Meds ordered this encounter  Medications  . HYDROcodone-acetaminophen (NORCO) 7.5-325 MG tablet    Sig: Take 1-2 tablets by mouth daily. For chronic pain syndrome. Max 45/month    Dispense:  45 tablet    Refill:  0  . HYDROcodone-acetaminophen (NORCO) 7.5-325 MG tablet    Sig: Take 1-2 tablets by mouth daily. For chronic pain syndrome. Max 45/month    Dispense:  45 tablet    Refill:  0  . HYDROcodone-acetaminophen (NORCO) 7.5-325 MG tablet    Sig: Take 1-2 tablets by mouth daily. For chronic pain syndrome. Max 45/month    Dispense:  45 tablet    Refill:  0   Follow-up plan:   Return in about 3 months (around 08/09/2019) for Medication Management, virtual.   Recent Visits Date Type Provider Dept  02/11/19 Office Visit Gillis Santa, MD [facility]  Showing recent visits within past 90 days and meeting all other requirements   Today's Visits Date Type Provider Dept  05/11/19 Office Visit Gillis Santa, MD [facility]  Showing today's visits and meeting all other requirements   Future Appointments No visits were found meeting these conditions.  Showing future appointments within next 90 days and meeting all other requirements   I discussed the assessment and treatment plan with the patient. The patient was provided an opportunity to ask questions and all were answered. The patient agreed with the plan and demonstrated an understanding of the instructions.  Patient advised to call back or seek an in-person evaluation if the symptoms or condition worsens.  Total duration of non-face-to-face encounter: 11minutes.  Note by: Gillis Santa, MD Date: 05/11/2019; Time: 12:06 PM

## 2019-06-02 ENCOUNTER — Other Ambulatory Visit: Payer: Self-pay | Admitting: Interventional Radiology

## 2019-06-02 DIAGNOSIS — N2889 Other specified disorders of kidney and ureter: Secondary | ICD-10-CM

## 2019-06-03 ENCOUNTER — Other Ambulatory Visit: Payer: Self-pay

## 2019-06-03 ENCOUNTER — Other Ambulatory Visit: Payer: Self-pay | Admitting: Interventional Radiology

## 2019-06-03 DIAGNOSIS — N2889 Other specified disorders of kidney and ureter: Secondary | ICD-10-CM

## 2019-07-08 ENCOUNTER — Ambulatory Visit
Admission: RE | Admit: 2019-07-08 | Discharge: 2019-07-08 | Disposition: A | Discharge status: Home / Self Care | Payer: Medicaid Other | Source: Ambulatory Visit | Attending: Interventional Radiology | Admitting: Interventional Radiology

## 2019-07-08 ENCOUNTER — Other Ambulatory Visit: Payer: Self-pay

## 2019-07-08 ENCOUNTER — Telehealth: Payer: Self-pay | Admitting: Interventional Radiology

## 2019-07-08 DIAGNOSIS — N2889 Other specified disorders of kidney and ureter: Secondary | ICD-10-CM

## 2019-07-08 NOTE — Telephone Encounter (Signed)
Called pt at scheduled time for today's tele medicine visit but there was no answer.  Will attempted to reschedule.  Ronny Bacon, MD Pager #: [phone]

## 2019-08-04 ENCOUNTER — Telehealth: Payer: Self-pay | Admitting: *Deleted

## 2019-08-04 NOTE — Telephone Encounter (Signed)
Attempted to call for pre appointment review of allergies/meds. Message left. 

## 2019-08-05 ENCOUNTER — Encounter: Payer: Self-pay | Admitting: Student in an Organized Health Care Education/Training Program

## 2019-08-09 ENCOUNTER — Other Ambulatory Visit: Payer: Self-pay

## 2019-08-09 ENCOUNTER — Ambulatory Visit
Payer: Medicaid Other | Attending: Student in an Organized Health Care Education/Training Program | Admitting: Student in an Organized Health Care Education/Training Program

## 2019-08-09 ENCOUNTER — Encounter: Payer: Self-pay | Admitting: Student in an Organized Health Care Education/Training Program

## 2019-08-09 DIAGNOSIS — G8929 Other chronic pain: Secondary | ICD-10-CM

## 2019-08-09 DIAGNOSIS — M1711 Unilateral primary osteoarthritis, right knee: Secondary | ICD-10-CM

## 2019-08-09 DIAGNOSIS — M170 Bilateral primary osteoarthritis of knee: Secondary | ICD-10-CM | POA: Diagnosis not present

## 2019-08-09 DIAGNOSIS — M1712 Unilateral primary osteoarthritis, left knee: Secondary | ICD-10-CM

## 2019-08-09 DIAGNOSIS — G894 Chronic pain syndrome: Secondary | ICD-10-CM

## 2019-08-09 MED ORDER — HYDROCODONE-ACETAMINOPHEN 7.5-325 MG PO TABS: 1.0000 | ORAL_TABLET | Freq: Every day | ORAL | 0 refills | Status: DC

## 2019-08-09 NOTE — Progress Notes (Signed)
Patient: Jeremy Conley  Service Category: E/M  Provider: Gillis Santa, MD  DOB: 17-Dec-1970  DOS: 08/09/2019  Location: Office  MRN: [id number]  Setting: Ambulatory outpatient  Referring Provider: [facility]  Type: Established Patient  Specialty: Interventional Pain Management  PCP: [facility]  Location: Home  Delivery: TeleHealth     Virtual Encounter - Pain Management PROVIDER NOTE: Information contained herein reflects review and annotations entered in association with encounter. Interpretation of such information and data should be left to medically-trained personnel. Information provided to patient can be located elsewhere in the medical record under "Patient Instructions". Document created using STT-dictation technology, any transcriptional errors that may result from process are unintentional.    Contact & Pharmacy Preferred: [phone] Home: [phone] (home) Mobile: [phone] (mobile) E-mail: [email or web address] [address] ([address]), [place]) [place] [address] Phone: [phone] Fax: [place], [place] [address] Phone: [phone] Fax: [phone]   Pre-screening  Jeremy Conley offered "in-person" vs "virtual" encounter. He indicated preferring virtual for this encounter.   Reason COVID-19*  Social distancing based on CDC and AMA recommendations.   I contacted Jeremy Conley on 08/09/2019 via telephone.      I clearly identified myself as Gillis Santa, MD. I verified that I was speaking with the correct person using two identifiers (Name: Jeremy Conley, and date of birth: 11-May-1971).  This visit was completed via telephone due to the restrictions of the COVID-19 pandemic. All issues as above were discussed and addressed but no physical exam was performed. If it was felt  that the patient should be evaluated in the office, they were directed there. The patient verbally consented to this visit. Patient was unable to complete an audio/visual visit due to Technical difficulties and/or Lack of internet. Due to the catastrophic nature of the COVID-19 pandemic, this visit was done through audio contact only.  Location of the patient: home address (see Epic for details)  Location of the provider: office  Consent I sought verbal advanced consent from Jeremy Conley for virtual visit interactions. I informed Mr. Risenhoover of possible security and privacy concerns, risks, and limitations associated with providing "not-in-person" medical evaluation and management services. I also informed Mr. Zhen of the availability of "in-person" appointments. Finally, I informed him that there would be a charge for the virtual visit and that he could be  personally, fully or partially, financially responsible for it. Mr. Colgate expressed understanding and agreed to proceed.   Historic Elements   Jeremy Conley is a 49 y.o. year old, male patient evaluated today after his last contact with our practice on 08/04/2019. Jeremy Conley  has a past medical history of Arthritis, Arthritis, Depression, GERD (gastroesophageal reflux disease), Hyperlipidemia, Hypertension, and Kidney mass. He also  has a past surgical history that includes Knee arthroscopy (Left, 1993 and 1997); IR Radiologist Eval & Mgmt (11/19/2017); IR Radiologist Eval & Mgmt (03/24/2018); and IR Radiologist Eval & Mgmt (11/12/2018). Jeremy Conley has a current medication list which includes the following prescription(s): aspirin ec, atorvastatin, chlorhexidine, clindamycin, hydrochlorothiazide, [START ON 08/13/2019] hydrocodone-acetaminophen, [START ON 09/12/2019] hydrocodone-acetaminophen, [START ON 10/12/2019] hydrocodone-acetaminophen, losartan, meloxicam, metoprolol succinate, omeprazole, tadalafil, triamcinolone cream, cholecalciferol,  atorvastatin, meloxicam, omeprazole, and zinc gluconate. He  reports that he has been smoking cigars. He has smoked for the past 15.00 years. He has  never used smokeless tobacco. He reports current alcohol use. He reports that he does not use drugs. Jeremy Conley has No Known Allergies.   HPI  Today, he is being contacted for medication management.   Episode of gout earlier this month, flare improved. Patient's pain is now is at baseline.  Patient continues multimodal pain regimen as prescribed.  States that it provides pain relief and improvement in functional status.  Pharmacotherapy Assessment  Analgesic: 07/14/2019  1   05/11/2019  Hydrocodone-Acetamin 7.5-325  45.00  30 Bi Lat   [id number]   [facility] (4231)   0  11.25 MME  Medicaid   [place]     Monitoring: [place] PMP: PDMP reviewed during this encounter.       Pharmacotherapy: No side-effects or adverse reactions reported. Compliance: No problems identified. Effectiveness: Clinically acceptable. Plan: Refer to "POC".  UDS:   Status:  Final result Visible to patient:  Yes (MyChart) Next appt:  None Dx:  Chronic pain syndrome  Ref Range & Units 8 mo ago  Amphetamines, IA Cutoff:50 ng/mL Negative   Barbiturates, IA Cutoff:0.1 ug/mL Negative   Benzodiazepines, IA Cutoff:20 ng/mL Negative   Cocaine & Metabolite, IA Cutoff:25 ng/mL Negative   Phencyclidine, IA Cutoff:8 ng/mL Negative   THC(Marijuana) Metabolite, IA Cutoff:5 ng/mL Negative   Opiates, IA Cutoff:5 ng/mL ++POSITIVE++Abnormal    Oxycodones, IA Cutoff:5 ng/mL Negative   Comment: Presumptive immunoassay result indicated need for further  testing; definitive confirmation was negative.   Methadone, IA Cutoff:25 ng/mL Negative   Propoxyphene, IA Cutoff:50 ng/mL Negative   Comment: This test was developed and its performance characteristics  determined by [facility]. It has not been cleared or approved  by the Food and Drug Administration.   Resulting Agency  [facility]     Narrative Performed by: Maryan Puls       Summary  Date Value Ref Range Status  12/10/2016 FINAL  Final    Comment:    ==================================================================== TOXASSURE COMP DRUG ANALYSIS,UR ==================================================================== Test                             Result       Flag       Units Drug Absent but Declared for Prescription Verification   Hydrocodone                    Not Detected UNEXPECTED ng/mg creat   Gabapentin                     Not Detected UNEXPECTED   Acetaminophen                  Not Detected UNEXPECTED    Acetaminophen, as indicated in the declared medication list, is    not always detected even when used as directed. ==================================================================== Test                      Result    Flag   Units      Ref Range   Creatinine              343              mg/dL      >=20 ==================================================================== Declared Medications:  The flagging and interpretation on this report are based on the  following declared medications.  Unexpected results may arise from  inaccuracies in the declared medications.  **Note: The testing  scope of this panel includes these medications:  Gabapentin (Neurontin)  Hydrocodone (Norco)  Hydrocodone (Vicodin)  **Note: The testing scope of this panel does not include small to  moderate amounts of these reported medications:  Acetaminophen (Norco)  Acetaminophen (Vicodin)  **Note: The testing scope of this panel does not include following  reported medications:  Hydrochlorothiazide (Hydrodiuril)  Losartan (Cozaar)  Meloxicam (Mobic) ==================================================================== For clinical consultation, please call [phone]. ====================================================================    Laboratory Chemistry Profile   Renal Lab Results  Component Value Date    BUN 17 04/08/2017   CREATININE 1.52 (H) 11/09/2018   GFRAA >60 11/09/2018   GFRNONAA 53 (L) 11/09/2018    Hepatic No results found for: AST, ALT, ALBUMIN, ALKPHOS, HCVAB, AMYLASE, LIPASE, AMMONIA  Electrolytes Lab Results  Component Value Date   NA 130 (L) 04/08/2017   K 3.5 04/08/2017   CL 96 (L) 04/08/2017   CALCIUM 9.2 04/08/2017    Bone No results found for: VD25OH, VD125OH2TOT, VB1660AY0, KH9977SF4, 25OHVITD1, 25OHVITD2, 25OHVITD3, TESTOFREE, TESTOSTERONE  Inflammation (CRP: Acute Phase) (ESR: Chronic Phase) No results found for: CRP, ESRSEDRATE, LATICACIDVEN    Note: Above Lab results reviewed.  Assessment  The primary encounter diagnosis was Primary osteoarthritis of right knee. Diagnoses of Chronic pain syndrome, Primary osteoarthritis of left knee, Morbid obesity ([place]), and Chronic pain of both knees were also pertinent to this visit.  Plan of Care   Jeremy Conley has a current medication list which includes the following long-term medication(s): atorvastatin, metoprolol succinate, omeprazole, tadalafil, atorvastatin, and omeprazole.  Pharmacotherapy (Medications Ordered): Meds ordered this encounter  Medications  . HYDROcodone-acetaminophen (NORCO) 7.5-325 MG tablet    Sig: Take 1-2 tablets by mouth daily. For chronic pain syndrome. Max 45/month    Dispense:  45 tablet    Refill:  0  . HYDROcodone-acetaminophen (NORCO) 7.5-325 MG tablet    Sig: Take 1-2 tablets by mouth daily. For chronic pain syndrome. Max 45/month    Dispense:  45 tablet    Refill:  0  . HYDROcodone-acetaminophen (NORCO) 7.5-325 MG tablet    Sig: Take 1-2 tablets by mouth daily. For chronic pain syndrome. Max 45/month    Dispense:  45 tablet    Refill:  0   Follow-up plan:   Return in about 3 months (around 11/09/2019) for Medication Management, in person.    Recent Visits Date Type Provider Dept  05/11/19 Office Visit Gillis Santa, MD [facility]  Showing recent visits  within past 90 days and meeting all other requirements   Today's Visits Date Type Provider Dept  08/09/19 Office Visit Gillis Santa, MD [facility]  Showing today's visits and meeting all other requirements   Future Appointments No visits were found meeting these conditions.  Showing future appointments within next 90 days and meeting all other requirements   I discussed the assessment and treatment plan with the patient. The patient was provided an opportunity to ask questions and all were answered. The patient agreed with the plan and demonstrated an understanding of the instructions.  Patient advised to call back or seek an in-person evaluation if the symptoms or condition worsens.  Duration of encounter: 25 minutes.  Note by: Gillis Santa, MD Date: 08/09/2019; Time: 2:19 PM

## 2019-08-23 ENCOUNTER — Other Ambulatory Visit: Payer: Self-pay | Admitting: Interventional Radiology

## 2019-08-23 ENCOUNTER — Ambulatory Visit
Admission: RE | Admit: 2019-08-23 | Discharge: 2019-08-23 | Disposition: A | Discharge status: Home / Self Care | Payer: Self-pay | Source: Ambulatory Visit | Attending: Interventional Radiology | Admitting: Interventional Radiology

## 2019-08-23 DIAGNOSIS — N2889 Other specified disorders of kidney and ureter: Secondary | ICD-10-CM

## 2019-09-01 ENCOUNTER — Encounter: Payer: Self-pay | Admitting: *Deleted

## 2019-09-01 ENCOUNTER — Other Ambulatory Visit: Payer: Self-pay

## 2019-09-01 ENCOUNTER — Ambulatory Visit
Admission: RE | Admit: 2019-09-01 | Discharge: 2019-09-01 | Disposition: A | Discharge status: Home / Self Care | Payer: Medicaid Other | Source: Ambulatory Visit | Attending: Interventional Radiology | Admitting: Interventional Radiology

## 2019-09-01 HISTORY — PX: IR RADIOLOGIST EVAL & MGMT: IMG5224

## 2019-09-01 NOTE — Progress Notes (Signed)
Patient ID: Jeremy Conley, male   DOB: 1971-04-23, 49 y.o.   MRN: [id number]         Chief Complaint: Left sided renal lesion  Referring Physician(s): Erlene Quan (Urology)  History of Present Illness: Jeremy Conley is a 49 y.o. male with past medical history significant for hypertension, hyperlipidemia and morbid obesity who was found to have an indeterminate left-sided renal lesion on outside abdominal CT performed for the work-up of a palpable knot within his mid abdomen (which was ultimately attributable to gastritis).  Patient was initially seen in the interventional radiology clinic on 11/19/2017 the patient has elected to pursue conservative management with continued observation of indolent behaving left-sided renal lesion.  Patient is now and seen in consultation via telemedicine after undergoing surveillance renal protocol CT scan performed 08/09/2019.  Patient is again without complaint.  Specifically, no flank pain or hematuria.   Past Medical History:  Diagnosis Date  . Arthritis   . Arthritis   . Depression   . GERD (gastroesophageal reflux disease)   . Hyperlipidemia   . Hypertension   . Kidney mass     Past Surgical History:  Procedure Laterality Date  . IR RADIOLOGIST EVAL & MGMT  11/19/2017  . IR RADIOLOGIST EVAL & MGMT  03/24/2018  . IR RADIOLOGIST EVAL & MGMT  11/12/2018  . KNEE ARTHROSCOPY Left 1993 and 1997    Allergies: Patient has no known allergies.  Medications: Prior to Admission medications   Medication Sig Start Date End Date Taking? Authorizing Provider  aspirin EC 81 MG tablet Take 81 mg by mouth daily.    [provider]  atorvastatin (LIPITOR) 20 MG tablet Take 20 mg by mouth. 01/16/17 08/05/19  [provider]  atorvastatin (LIPITOR) 20 MG tablet Take 20 mg by mouth daily. 02/16/18 03/03/19  [provider]  chlorhexidine (HIBICLENS) 4 % external liquid Use as a scalp and body wash. 02/05/19   [provider]  clindamycin (CLEOCIN T) 1 % external solution Apply topically to scalp and face daily. 01/14/17   [provider]  hydrochlorothiazide (HYDRODIURIL) 25 MG tablet Take 25 mg by mouth daily. 04/20/18   [provider]  HYDROcodone-acetaminophen (NORCO) 7.5-325 MG tablet Take 1-2 tablets by mouth daily. For chronic pain syndrome. Max 45/month 08/13/19 09/12/19  Gillis Santa, MD  HYDROcodone-acetaminophen (NORCO) 7.5-325 MG tablet Take 1-2 tablets by mouth daily. For chronic pain syndrome. Max 45/month 09/12/19 10/12/19  Gillis Santa, MD  HYDROcodone-acetaminophen (NORCO) 7.5-325 MG tablet Take 1-2 tablets by mouth daily. For chronic pain syndrome. Max 45/month 10/12/19 11/11/19  Gillis Santa, MD  losartan (COZAAR) 100 MG tablet Take 100 mg by mouth daily. 04/20/18   [provider]  meloxicam (MOBIC) 15 MG tablet Take 15 mg by mouth daily. 08/12/18   [provider]  meloxicam (MOBIC) 7.5 MG tablet Take 7.5 mg by mouth. 03/12/17   [provider]  metoprolol succinate (TOPROL-XL) 25 MG 24 hr tablet Take by mouth. 01/14/19 01/14/20  [provider]  omeprazole (PRILOSEC) 20 MG capsule Take 20 mg by mouth daily. 09/06/18   [provider]  Omeprazole 20 MG TBEC Take by mouth. 08/06/17 05/10/19  [provider]  tadalafil (CIALIS) 5 MG tablet TAKE ONE TABLET BY MOUTH DAILY AS NEEDED FOR ERECTILE DYSFUNCTION 04/08/19   [name], MD  triamcinolone cream (KENALOG) 0.1 % Apply to rash on arm 1-2 times daily as needed. 01/07/17   [provider]  VITAMIN D, CHOLECALCIFEROL,  PO Take 5,000 Units by mouth daily.    [provider]  zinc gluconate 50 MG tablet Take by mouth.    [provider]     Family History  Problem Relation Age of Onset  . Heart disease Father   . Bladder Cancer Neg Hx   . Kidney cancer Neg Hx   . Prostate cancer Neg Hx     Social History   Socioeconomic History  . Marital status:  Single    Spouse name: Not on file  . Number of children: Not on file  . Years of education: Not on file  . Highest education level: Not on file  Occupational History  . Not on file  Tobacco Use  . Smoking status: Light Tobacco Smoker    Years: 15.00    Types: Cigars  . Smokeless tobacco: Never Used  Substance and Sexual Activity  . Alcohol use: Yes    Comment: occassional  . Drug use: No  . Sexual activity: Yes    Birth control/protection: None  Other Topics Concern  . Not on file  Social History Narrative  . Not on file   Social Determinants of Health   Financial Resource Strain:   . Difficulty of Paying Living Expenses:   Food Insecurity:   . Worried About Charity fundraiser in the Last Year:   . Arboriculturist in the Last Year:   Transportation Needs:   . Film/video editor (Medical):   Marland Kitchen Lack of Transportation (Non-Medical):   Physical Activity:   . Days of Exercise per Week:   . Minutes of Exercise per Session:   Stress:   . Feeling of Stress :   Social Connections:   . Frequency of Communication with Friends and Family:   . Frequency of Social Gatherings with Friends and Family:   . Attends Religious Services:   . Active Member of Clubs or Organizations:   . Attends Archivist Meetings:   Marland Kitchen Marital Status:     ECOG Status: 0 - Asymptomatic  Review of Systems  Review of Systems: A 12 point ROS discussed and pertinent positives are indicated in the HPI above.  All other systems are negative.  Physical Exam No direct physical exam was performed (except for noted visual exam findings with Video Visits).   Vital Signs: There were no vitals taken for this visit.  Imaging:  Selected images from the following examination were reviewed in detail abdominal MRI - 08/21/2017; 03/24/2018; 11/09/2018; renal protocol CT scan - 08/09/2019.  Left-sided renal lesion measurements as follows:  08/21/2017 - 1.1 x 1.1 cm. 04/03/2018 - 1.6 x 1.5  cm. 11/09/2018 - 1.7 x 1.6 cm. 08/09/2019 - 2.0 x 1.8 cm  CT OUTSIDE FILMS BODY/ABD/PELVIS  Result Date: 08/23/2019 This examination belongs to an outside facility and is stored here for comparison purposes only.  Contact the originating outside institution for any associated report or interpretation.   Labs:  CBC: No results for input(s): WBC, HGB, HCT, PLT in the last 8760 hours.  COAGS: No results for input(s): INR, APTT in the last 8760 hours.  BMP: Recent Labs    11/09/18 1140  CREATININE 1.52*  GFRNONAA 53*  GFRAA >60    LIVER FUNCTION TESTS: No results for input(s): BILITOT, AST, ALT, ALKPHOS, PROT, ALBUMIN in the last 8760 hours.  TUMOR MARKERS: No results for input(s): AFPTM, CEA, CA199, CHROMGRNA in the last 8760 hours.  Assessment and Plan:  Jerame Duerksen Snipesis  a 49 y.o.malewith past medical history significant for hypertension, hyperlipidemia and morbid obesity who has been followed for an incidentally discovered asymptomatic left-sided renal lesion.  I explained to the patient that as the left-sided renal lesion demonstrates imaging characteristics compatible with a renal cell carcinoma, and as such, biopsy is not warranted.  Left-sided renal lesion measurements as follows (by my direct remeasurement):  08/21/2017 - 1.1 x 1.1 cm. 04/03/2018 - 1.6 x 1.5 cm. 11/09/2018 - 1.7 x 1.6 cm. 08/09/2019 - 2.0 x 1.8 cm  Given continued (albeit slow) growth of the left-sided renal lesion, I feel that dedicated intervention is warranted at this time.  I explained that given his medical co-morbidities, he has been deemed a poor operative candidate by referring urologist, Dr. Erlene Quan, and as such, prolonged conversations were held with the patient regarding the benefits and risks of left-sided renal cryoablation including but not limited to bleeding, infection, worsening renal function and incomplete ablation.    I explained that as the lesion gets closer to 3 cm, the  likelihood of incomplete ablation rises.  Additionally, I explained that imaging would likely be difficult due to his body habitus and therefore a smaller lesion would likely be more amenable to a complete and successful ablation.   Despite my encouragement, the patient is again hesitant to undergo this procedure stating that his sister recently passed away after undergoing an unspecified cancer related surgery.  Additionally, the patient states that he is meeting with a nutritionalist and is hopeful he may be able to lose weight during the next 6 months.  I explained to the patient that I feel it will be very unlikely he will be able to lose a clinically significant amount of weight prior to the time of the next surveillance scan in 6 months, however I am sympathetic to his hesitancy to undergo a procedure for this asymptomatic lesion.  As such we will proceed with obtaining a renal protocol CT scan in 6 months (late September/early October 2021).  It is very likely that the lesion will have minimally increased in size in the interval and again I will readdress the appropriateness of proceeding with renal cryoablation at that time.  Patient knows to call the interventional radiology clinic with any interval questions or concerns.  Plan:  - RTC following acquisition of surveillance renal protocol CT scan in 6 months (late September/early October). - Patient's current weight is 345 with goal weight of 280.   A copy of this report was sent to the requesting provider on this date.  Electronically Signed: Sandi Mariscal 09/01/2019, 9:52 AM   I spent a total of 15 Minutes in remote  clinical consultation, greater than 50% of which was counseling/coordinating care for indeterminate left-sided renal lesion.    Visit type: Audio only (telephone). Audio (no video) only due to patient's lack of internet/smartphone capability. Alternative for in-person consultation at [facility], [address]  [place], [place], [place]. This visit type was conducted due to national recommendations for restrictions regarding the COVID-19 Pandemic (e.g. social distancing).  This format is felt to be most appropriate for this patient at this time.  All issues noted in this document were discussed and addressed.

## 2019-10-11 ENCOUNTER — Telehealth: Payer: Self-pay

## 2019-10-11 NOTE — Telephone Encounter (Signed)
done

## 2019-10-11 NOTE — Telephone Encounter (Signed)
He called to say his insurance will need a prior auth done for his medication

## 2019-11-09 ENCOUNTER — Other Ambulatory Visit: Payer: Self-pay

## 2019-11-09 ENCOUNTER — Encounter: Payer: Self-pay | Admitting: Student in an Organized Health Care Education/Training Program

## 2019-11-09 ENCOUNTER — Ambulatory Visit
Payer: Medicaid Other | Attending: Student in an Organized Health Care Education/Training Program | Admitting: Student in an Organized Health Care Education/Training Program

## 2019-11-09 DIAGNOSIS — M25562 Pain in left knee: Secondary | ICD-10-CM

## 2019-11-09 DIAGNOSIS — M1711 Unilateral primary osteoarthritis, right knee: Secondary | ICD-10-CM | POA: Diagnosis not present

## 2019-11-09 DIAGNOSIS — G894 Chronic pain syndrome: Secondary | ICD-10-CM | POA: Diagnosis not present

## 2019-11-09 DIAGNOSIS — M1712 Unilateral primary osteoarthritis, left knee: Secondary | ICD-10-CM

## 2019-11-09 DIAGNOSIS — G8929 Other chronic pain: Secondary | ICD-10-CM

## 2019-11-09 DIAGNOSIS — M25561 Pain in right knee: Secondary | ICD-10-CM

## 2019-11-09 MED ORDER — HYDROCODONE-ACETAMINOPHEN 7.5-325 MG PO TABS: 1.0000 | ORAL_TABLET | Freq: Every day | ORAL | 0 refills | Status: DC

## 2019-11-09 NOTE — Progress Notes (Signed)
Patient: Jeremy Conley  Service Category: E/M  Provider: Gillis Santa, MD  DOB: 1971/01/16  DOS: 11/09/2019  Location: Office  MRN: [id number]  Setting: Ambulatory outpatient  Referring Provider: [facility]  Type: Established Patient  Specialty: Interventional Pain Management  PCP: [facility]  Location: Home  Delivery: TeleHealth     Virtual Encounter - Pain Management PROVIDER NOTE: Information contained herein reflects review and annotations entered in association with encounter. Interpretation of such information and data should be left to medically-trained personnel. Information provided to patient can be located elsewhere in the medical record under "Patient Instructions". Document created using STT-dictation technology, any transcriptional errors that may result from process are unintentional.    Contact & Pharmacy Preferred: [phone] Home: [phone] (home) Mobile: [phone] (mobile) E-mail: [email or web address]  [facility] [place] ([address]), [place] [address]) [place] [address] Phone: [phone] Fax: [place], [place] [address] Phone: [phone] Fax: [phone]   Pre-screening  Mr. Dibbern offered "in-person" vs "virtual" encounter. He indicated preferring virtual for this encounter.   Reason COVID-19*  Social distancing based on CDC and AMA recommendations.   I contacted SAYAN ALDAVA on 11/09/2019 via video conference.      I clearly identified myself as Gillis Santa, MD. I verified that I was speaking with the correct person using two identifiers (Name: TEVION LAFORGE, and date of birth: 06/03/1970).  Consent I sought verbal advanced consent from Mora Appl for virtual visit interactions. I informed Mr. Mancias of possible security and privacy concerns, risks, and  limitations associated with providing "not-in-person" medical evaluation and management services. I also informed Mr. Votaw of the availability of "in-person" appointments. Finally, I informed him that there would be a charge for the virtual visit and that he could be  personally, fully or partially, financially responsible for it. Mr. Ruddock expressed understanding and agreed to proceed.   Historic Elements   Mr. KHIRY PASQUARIELLO is a 49 y.o. year old, male patient evaluated today after his last contact with our practice on 10/11/2019. Mr. Blasdell  has a past medical history of Arthritis, Arthritis, Depression, GERD (gastroesophageal reflux disease), Hyperlipidemia, Hypertension, and Kidney mass. He also  has a past surgical history that includes Knee arthroscopy (Left, 1993 and 1997); IR Radiologist Eval & Mgmt (11/19/2017); IR Radiologist Eval & Mgmt (03/24/2018); IR Radiologist Eval & Mgmt (11/12/2018); and IR Radiologist Eval & Mgmt (09/01/2019). Mr. Cosgriff has a current medication list which includes the following prescription(s): aspirin ec, atorvastatin, atorvastatin, chlorhexidine, clindamycin, hydrochlorothiazide, [START ON 11/11/2019] hydrocodone-acetaminophen, [START ON 12/11/2019] hydrocodone-acetaminophen, [START ON 01/10/2020] hydrocodone-acetaminophen, losartan, meloxicam, meloxicam, metoprolol succinate, omeprazole, omeprazole, tadalafil, triamcinolone cream, cholecalciferol, and zinc gluconate. He  reports that he has been smoking cigars. He has smoked for the past 15.00 years. He has never used smokeless tobacco. He reports current alcohol use. He reports that he does not use drugs. Mr. Latona has No Known Allergies.   HPI  Today, he is being contacted for medication management.   No change in medical history since last visit.  Patient's pain is at baseline.  Patient continues multimodal pain regimen as prescribed.  States that it provides pain relief and improvement in functional  status.  Pharmacotherapy Assessment  Analgesic: 10/12/2019  1   08/09/2019  Hydrocodone-Acetamin 7.5-325  45.00  30 Bi Lat   [id number]  Wal (4231)   0  11.25 MME  Medicaid   [place]    Monitoring:  PMP: PDMP reviewed during this encounter.       Pharmacotherapy: No side-effects or adverse reactions reported. Compliance: No problems identified. Effectiveness: Clinically acceptable. Plan: Refer to "POC".  UDS:  Summary  Date Value Ref Range Status  12/10/2016 FINAL  Final    Comment:    ==================================================================== TOXASSURE COMP DRUG ANALYSIS,UR ==================================================================== Test                             Result       Flag       Units Drug Absent but Declared for Prescription Verification   Hydrocodone                    Not Detected UNEXPECTED ng/mg creat   Gabapentin                     Not Detected UNEXPECTED   Acetaminophen                  Not Detected UNEXPECTED    Acetaminophen, as indicated in the declared medication list, is    not always detected even when used as directed. ==================================================================== Test                      Result    Flag   Units      Ref Range   Creatinine              343              mg/dL      >=20 ==================================================================== Declared Medications:  The flagging and interpretation on this report are based on the  following declared medications.  Unexpected results may arise from  inaccuracies in the declared medications.  **Note: The testing scope of this panel includes these medications:  Gabapentin (Neurontin)  Hydrocodone (Norco)  Hydrocodone (Vicodin)  **Note: The testing scope of this panel does not include small to  moderate amounts of these reported medications:  Acetaminophen (Norco)  Acetaminophen (Vicodin)  **Note: The testing scope of this panel does not include following   reported medications:  Hydrochlorothiazide (Hydrodiuril)  Losartan (Cozaar)  Meloxicam (Mobic) ==================================================================== For clinical consultation, please call [phone]. ====================================================================     Laboratory Chemistry Profile   Renal Lab Results  Component Value Date   BUN 17 04/08/2017   CREATININE 1.52 (H) 11/09/2018   GFRAA >60 11/09/2018   GFRNONAA 53 (L) 11/09/2018     Hepatic No results found for: AST, ALT, ALBUMIN, ALKPHOS, HCVAB, AMYLASE, LIPASE, AMMONIA   Electrolytes Lab Results  Component Value Date   NA 130 (L) 04/08/2017   K 3.5 04/08/2017   CL 96 (L) 04/08/2017   CALCIUM 9.2 04/08/2017     Bone No results found for: VD25OH, VD125OH2TOT, [id number], XH3716RC7, 25OHVITD1, 25OHVITD2, 25OHVITD3, TESTOFREE, TESTOSTERONE   Inflammation (CRP: Acute Phase) (ESR: Chronic Phase) No results found for: CRP, ESRSEDRATE, LATICACIDVEN     Note: Above Lab results reviewed.   Imaging  IR Radiologist Eval & Mgmt Please refer to notes tab for details about interventional procedure. (Op  Note)  Assessment  The primary encounter diagnosis was Primary osteoarthritis of right knee. Diagnoses of Chronic pain syndrome, Primary osteoarthritis of left knee, Morbid obesity ([place]), and Chronic pain of both knees were also pertinent to this  visit.  Plan of Care  Mr. Mora Appl has a current medication list which includes the following long-term medication(s): atorvastatin, atorvastatin, metoprolol succinate, omeprazole, omeprazole, and tadalafil.  Pharmacotherapy (Medications Ordered): Meds ordered this encounter  Medications  . HYDROcodone-acetaminophen (NORCO) 7.5-325 MG tablet    Sig: Take 1-2 tablets by mouth daily. For chronic pain syndrome. Max 45/month    Dispense:  45 tablet    Refill:  0  . HYDROcodone-acetaminophen (NORCO) 7.5-325 MG tablet    Sig: Take 1-2 tablets  by mouth daily. For chronic pain syndrome. Max 45/month    Dispense:  45 tablet    Refill:  0  . HYDROcodone-acetaminophen (NORCO) 7.5-325 MG tablet    Sig: Take 1-2 tablets by mouth daily. For chronic pain syndrome. Max 45/month    Dispense:  45 tablet    Refill:  0   Orders:  Orders Placed This Encounter  Procedures  . Drug Screen 10 W/Conf, Serum    Order Specific Question:   Release to patient    Answer:   Immediate   Follow-up plan:   No follow-ups on file.   Recent Visits No visits were found meeting these conditions. Showing recent visits within past 90 days and meeting all other requirements Today's Visits Date Type Provider Dept  11/09/19 Telemedicine Gillis Santa, MD [facility]  Showing today's visits and meeting all other requirements Future Appointments No visits were found meeting these conditions. Showing future appointments within next 90 days and meeting all other requirements  I discussed the assessment and treatment plan with the patient. The patient was provided an opportunity to ask questions and all were answered. The patient agreed with the plan and demonstrated an understanding of the instructions.  Patient advised to call back or seek an in-person evaluation if the symptoms or condition worsens.  Duration of encounter:30 minutes.  Note by: Gillis Santa, MD Date: 11/09/2019; Time: 1:29 PM

## 2019-11-23 LAB — DRUG SCREEN 10 W/CONF, SERUM
Amphetamines, IA: NEGATIVE ng/mL
Barbiturates, IA: NEGATIVE ug/mL
Benzodiazepines, IA: NEGATIVE ng/mL
Cocaine & Metabolite, IA: NEGATIVE ng/mL
Methadone, IA: NEGATIVE ng/mL
Opiates, IA: POSITIVE ng/mL — AB
Oxycodones, IA: NEGATIVE ng/mL
Phencyclidine, IA: NEGATIVE ng/mL
Propoxyphene, IA: NEGATIVE ng/mL
THC(Marijuana) Metabolite, IA: NEGATIVE ng/mL

## 2019-11-23 LAB — OXYCODONES,MS,WB/SP RFX
Oxycocone: NEGATIVE ng/mL
Oxycodones Confirmation: NEGATIVE
Oxymorphone: NEGATIVE ng/mL

## 2019-11-23 LAB — OPIATES,MS,WB/SP RFX
6-Acetylmorphine: NEGATIVE
Codeine: NEGATIVE ng/mL
Dihydrocodeine: 1.6 ng/mL
Hydrocodone: 7.2 ng/mL
Hydromorphone: NEGATIVE ng/mL
Morphine: NEGATIVE ng/mL
Opiate Confirmation: POSITIVE

## 2019-12-13 ENCOUNTER — Telehealth: Payer: Self-pay | Admitting: Student in an Organized Health Care Education/Training Program

## 2019-12-13 NOTE — Telephone Encounter (Signed)
Patient notified PA request submitted.

## 2019-12-13 NOTE — Telephone Encounter (Signed)
Pt called and stated that since Medicaid has changed that he now needs PA for his hydrocodone

## 2019-12-16 ENCOUNTER — Other Ambulatory Visit: Payer: Self-pay | Admitting: Student in an Organized Health Care Education/Training Program

## 2019-12-16 ENCOUNTER — Telehealth: Payer: Self-pay | Admitting: Student in an Organized Health Care Education/Training Program

## 2019-12-16 DIAGNOSIS — M1711 Unilateral primary osteoarthritis, right knee: Secondary | ICD-10-CM

## 2019-12-16 DIAGNOSIS — G894 Chronic pain syndrome: Secondary | ICD-10-CM

## 2019-12-16 MED ORDER — HYDROCODONE-ACETAMINOPHEN 7.5-325 MG PO TABS: 1.0000 | ORAL_TABLET | Freq: Every day | ORAL | 0 refills | Status: DC

## 2019-12-16 NOTE — Telephone Encounter (Signed)
Pt called stating he wants Dr Holley Raring to send in a new prescription for his pain medication and he will just pay out of pocket for it because the PA is taking too long and he is out of medication they only gave him 5 days worth.

## 2019-12-16 NOTE — Progress Notes (Signed)
Due to change in patient's insurance, pharmacy only filled 1 weeks worth.  New prescription is being sent below for remaining 3 weeks.  PMP checked and appropriate.  Requested Prescriptions   Signed Prescriptions Disp Refills   HYDROcodone-acetaminophen (NORCO) 7.5-325 MG tablet 35 tablet 0    Sig: Take 1-2 tablets by mouth daily for 25 days. For chronic pain syndrome. Max 45/month

## 2019-12-16 NOTE — Telephone Encounter (Signed)
Pt called with updated info for PA. [employer] [phone] press 0 then 0 again and you will get someone on the line for the PA process. ID number is still the same as it was with medicaid. Also said we can send it online through the portal if we have it.

## 2019-12-16 NOTE — Telephone Encounter (Signed)
I called [facility], verified that only 10 tabs were filled. Dr. Holley Raring agreed to send a new script for the pharmacy for the remaining 35 tabs. Patient notified.

## 2019-12-16 NOTE — Telephone Encounter (Signed)
I called Medicaid to check status of PA for Hydrocodone. They said it was denied because patient no longer has Medicaid. He has [employer]. I called the patient, he is not aware of this. He will look through his mail to see if he has a new card and call us back with the info.

## 2019-12-16 NOTE — Telephone Encounter (Signed)
I called this number, they told me the PA must be done by fax. They will fax the form to our office.

## 2020-02-01 NOTE — Telephone Encounter (Signed)
, °

## 2020-02-08 ENCOUNTER — Encounter: Payer: Self-pay | Admitting: Student in an Organized Health Care Education/Training Program

## 2020-02-08 ENCOUNTER — Other Ambulatory Visit: Payer: Self-pay

## 2020-02-08 ENCOUNTER — Ambulatory Visit
Payer: Medicaid Other | Attending: Student in an Organized Health Care Education/Training Program | Admitting: Student in an Organized Health Care Education/Training Program

## 2020-02-08 DIAGNOSIS — G894 Chronic pain syndrome: Secondary | ICD-10-CM | POA: Diagnosis not present

## 2020-02-08 DIAGNOSIS — M1711 Unilateral primary osteoarthritis, right knee: Secondary | ICD-10-CM

## 2020-02-08 DIAGNOSIS — M1712 Unilateral primary osteoarthritis, left knee: Secondary | ICD-10-CM

## 2020-02-08 DIAGNOSIS — G8929 Other chronic pain: Secondary | ICD-10-CM

## 2020-02-08 DIAGNOSIS — I10 Essential (primary) hypertension: Secondary | ICD-10-CM

## 2020-02-08 DIAGNOSIS — Z9889 Other specified postprocedural states: Secondary | ICD-10-CM

## 2020-02-08 DIAGNOSIS — M25562 Pain in left knee: Secondary | ICD-10-CM

## 2020-02-08 DIAGNOSIS — M25561 Pain in right knee: Secondary | ICD-10-CM

## 2020-02-08 MED ORDER — HYDROCODONE-ACETAMINOPHEN 7.5-325 MG PO TABS: 1.0000 | ORAL_TABLET | Freq: Every day | ORAL | 0 refills | Status: DC

## 2020-02-08 NOTE — Progress Notes (Signed)
Patient: Jeremy Conley  Service Category: E/M  Provider: Gillis Santa, MD  DOB: [birth year]  DOS: 02/08/2020  Location: Office  MRN: [id number]  Setting: Ambulatory outpatient  Referring Provider: [facility]  Type: Established Patient  Specialty: Interventional Pain Management  PCP: [facility]  Location: Home  Delivery: TeleHealth     Virtual Encounter - Pain Management PROVIDER NOTE: Information contained herein reflects review and annotations entered in association with encounter. Interpretation of such information and data should be left to medically-trained personnel. Information provided to patient can be located elsewhere in the medical record under "Patient Instructions". Document created using STT-dictation technology, any transcriptional errors that may result from process are unintentional.    Contact & Pharmacy Preferred: [phone] Home: [phone] (home) Mobile: [phone] (mobile) E-mail: [email or web address] (N), [place]) [place] [address] Phone: [phone] Fax: [place], [place] [address] Phone: [phone] Fax: [phone]   Pre-screening  Jeremy Conley offered "in-person" vs "virtual" encounter. He indicated preferring virtual for this encounter.   Reason COVID-19*  Social distancing based on CDC and AMA recommendations.   I contacted Jeremy Conley on 02/08/2020 via video conference.      I clearly identified myself as Gillis Santa, MD. I verified that I was speaking with the correct person using two identifiers (Name: Jeremy Conley, and date of birth: [birth year]).  Consent I sought verbal advanced consent from Jeremy Conley for virtual visit interactions. I informed Jeremy Conley of possible security and privacy concerns, risks, and  limitations associated with providing "not-in-person" medical evaluation and management services. I also informed Jeremy Conley of the availability of "in-person" appointments. Finally, I informed him that there would be a charge for the virtual visit and that he could be  personally, fully or partially, financially responsible for it. Jeremy Conley expressed understanding and agreed to proceed.   Historic Elements   Jeremy Conley is a 49 y.o. year old, male patient evaluated today after our last contact on 12/16/2019. Jeremy Conley  has a past medical history of Arthritis, Arthritis, Depression, GERD (gastroesophageal reflux disease), Hyperlipidemia, Hypertension, and Kidney mass. He also  has a past surgical history that includes Knee arthroscopy (Left, 1993 and 1997); IR Radiologist Eval & Mgmt (11/19/2017); IR Radiologist Eval & Mgmt (03/24/2018); IR Radiologist Eval & Mgmt (11/12/2018); and IR Radiologist Eval & Mgmt (09/01/2019). Jeremy Conley has a current medication list which includes the following prescription(s): [START ON 02/09/2020] hydrocodone-acetaminophen, aspirin ec, atorvastatin, atorvastatin, chlorhexidine, clindamycin, hydrochlorothiazide, [START ON 03/10/2020] hydrocodone-acetaminophen, [START ON 04/09/2020] hydrocodone-acetaminophen, losartan, meloxicam, meloxicam, metoprolol succinate, omeprazole, omeprazole, tadalafil, triamcinolone cream, cholecalciferol, and zinc gluconate. He  reports that he has been smoking cigars. He has smoked for the past 15.00 years. He has never used smokeless tobacco. He reports current alcohol use. He reports that he does not use drugs. Jeremy Conley has No Known Allergies.   HPI  Today, he is being contacted for medication management.   Patient tested positive for COVID-19 this past Friday.  He states that he has not been feeling too well.  Feels fatigued, drained with mild cough.  This is why we are doing a virtual visit.  Patient's prior urine toxicology screen as  below is appropriate and up-to-date.  We will refill his hydrocodone as below.  No change in dose.  Patient encouraged to stay hydrated and to continue with p.o. intake.  Pharmacotherapy Assessment  Analgesic: 01/10/2020  1   11/09/2019  Hydrocodone-Acetamin 7.5-325  45.00  30 Bi Lat   [id number]   Wal (1194)   0/0  11.25 MME  Medicaid       Monitoring: [place] PMP: PDMP reviewed during this encounter.       Pharmacotherapy: No side-effects or adverse reactions reported. Compliance: No problems identified. Effectiveness: Clinically acceptable. Plan: Refer to "POC".  UDS:   0 Result Notes  Ref Range & Units 3 mo ago 1 yr ago  Opiate Confirmation  Positive  Positive   Codeine ng/mL Negative  Negative   Morphine ng/mL Negative  Negative   6-Acetylmorphine  Negative  Negative   Hydrocodone ng/mL 7.2  11.5   Hydromorphone ng/mL Negative  Negative   Dihydrocodeine ng/mL 1.6  1.4 CM   Comment: Expected metabolism of opiate class drugs:   Parent Drug    Detected Metabolites  -----------    --------------------  Codeine:     Major: Morphine           Minor: Hydrocodone, Hydromorphone,               Dihydrocodeine  Morphine:     Minor: Hydromorphone  Hydrocodone:   Hydromorphone, Dihydrocodeine  Hydromorphone:  None  Dihydrocodeine:  None  Heroin:      6-Acetylmorphine           Morphine           Codeine, in small amounts in comparison            to morphine, is often detected when            heroin is the source drug.  Confirmation threshold: 1.0 ng/mL   Resulting Agency  [facility]    Narrative Performed by: Maryan Puls       Summary  Date Value Ref Range Status  12/10/2016 FINAL  Final    Comment:    ==================================================================== TOXASSURE COMP DRUG ANALYSIS,UR ==================================================================== Test                              Result       Flag       Units Drug Absent but Declared for Prescription Verification   Hydrocodone                    Not Detected UNEXPECTED ng/mg creat   Gabapentin                     Not Detected UNEXPECTED   Acetaminophen                  Not Detected UNEXPECTED    Acetaminophen, as indicated in the declared medication list, is    not always detected even when used as directed. ==================================================================== Test                      Result    Flag   Units      Ref Range   Creatinine              343              mg/dL      >=20 ==================================================================== Declared Medications:  The flagging and interpretation on this report are based on the  following declared medications.  Unexpected results  may arise from  inaccuracies in the declared medications.  **Note: The testing scope of this panel includes these medications:  Gabapentin (Neurontin)  Hydrocodone (Norco)  Hydrocodone (Vicodin)  **Note: The testing scope of this panel does not include small to  moderate amounts of these reported medications:  Acetaminophen (Norco)  Acetaminophen (Vicodin)  **Note: The testing scope of this panel does not include following  reported medications:  Hydrochlorothiazide (Hydrodiuril)  Losartan (Cozaar)  Meloxicam (Mobic) ==================================================================== For clinical consultation, please call [phone]. ====================================================================     Laboratory Chemistry Profile   Renal Lab Results  Component Value Date   BUN 17 04/08/2017   CREATININE 1.52 (H) 11/09/2018   GFRAA >60 11/09/2018   GFRNONAA 53 (L) 11/09/2018     Hepatic No results found for: AST, ALT, ALBUMIN, ALKPHOS, HCVAB, AMYLASE, LIPASE, AMMONIA   Electrolytes Lab Results  Component Value Date   NA 130 (L) 04/08/2017   K 3.5 04/08/2017    CL 96 (L) 04/08/2017   CALCIUM 9.2 04/08/2017     Bone No results found for: VD25OH, VD125OH2TOT, [id number], [id number], 25OHVITD1, 25OHVITD2, 25OHVITD3, TESTOFREE, TESTOSTERONE   Inflammation (CRP: Acute Phase) (ESR: Chronic Phase) No results found for: CRP, ESRSEDRATE, LATICACIDVEN     Note: Above Lab results reviewed.  Imaging  IR Radiologist Eval & Mgmt Please refer to notes tab for details about interventional procedure. (Op  Note)  Assessment  The primary encounter diagnosis was Primary osteoarthritis of left knee. Diagnoses of Chronic pain syndrome, Primary osteoarthritis of right knee, Morbid obesity ([place]), Chronic pain of both knees, Essential hypertension, and H/O arthroscopic knee surgery were also pertinent to this visit.  Plan of Care  Mr. Jeremy Conley has a current medication list which includes the following long-term medication(s): [START ON 02/09/2020] hydrocodone-acetaminophen, atorvastatin, atorvastatin, [START ON 03/10/2020] hydrocodone-acetaminophen, [START ON 04/09/2020] hydrocodone-acetaminophen, metoprolol succinate, omeprazole, omeprazole, and tadalafil.  Pharmacotherapy (Medications Ordered): Meds ordered this encounter  Medications  . HYDROcodone-acetaminophen (NORCO) 7.5-325 MG tablet    Sig: Take 1-2 tablets by mouth daily. For chronic pain syndrome. Max 45/month    Dispense:  45 tablet    Refill:  0  . HYDROcodone-acetaminophen (NORCO) 7.5-325 MG tablet    Sig: Take 1-2 tablets by mouth daily. For chronic pain syndrome. Max 45/month    Dispense:  45 tablet    Refill:  0  . HYDROcodone-acetaminophen (NORCO) 7.5-325 MG tablet    Sig: Take 1-2 tablets by mouth daily. For chronic pain syndrome. Max 45/month    Dispense:  45 tablet    Refill:  0  Follow-up plan:   Return in about 3 months (around 05/09/2020) for Medication Management, in person.   Recent Visits No visits were found meeting these conditions. Showing recent visits within past 90 days  and meeting all other requirements Today's Visits Date Type Provider Dept  02/08/20 Telemedicine Gillis Santa, MD [facility]  Showing today's visits and meeting all other requirements Future Appointments No visits were found meeting these conditions. Showing future appointments within next 90 days and meeting all other requirements  I discussed the assessment and treatment plan with the patient. The patient was provided an opportunity to ask questions and all were answered. The patient agreed with the plan and demonstrated an understanding of the instructions.  Patient advised to call back or seek an in-person evaluation if the symptoms or condition worsens.  Duration of encounter: 30 minutes.  Note by: Gillis Santa, MD Date: 02/08/2020; Time: 11:08 AM

## 2020-02-28 ENCOUNTER — Other Ambulatory Visit: Payer: Self-pay

## 2020-02-28 ENCOUNTER — Other Ambulatory Visit: Payer: Self-pay | Admitting: Interventional Radiology

## 2020-02-28 DIAGNOSIS — N2889 Other specified disorders of kidney and ureter: Secondary | ICD-10-CM

## 2020-03-21 ENCOUNTER — Telehealth: Payer: Self-pay | Admitting: Interventional Radiology

## 2020-03-21 ENCOUNTER — Ambulatory Visit
Admission: RE | Admit: 2020-03-21 | Discharge: 2020-03-21 | Disposition: A | Discharge status: Home / Self Care | Payer: Medicaid Other | Source: Ambulatory Visit | Attending: Interventional Radiology | Admitting: Interventional Radiology

## 2020-03-21 ENCOUNTER — Other Ambulatory Visit: Payer: Self-pay

## 2020-03-21 DIAGNOSIS — N2889 Other specified disorders of kidney and ureter: Secondary | ICD-10-CM

## 2020-03-21 NOTE — Telephone Encounter (Signed)
The patient was scheduled for telemedicine consultation to review surveillance renal protocol CT performed 03/13/2020, however the examination was performed at [facility] and the outside imaging and report was not loaded into our PACS system at the time of this consultation.  As such, the images will be loaded to our system and follow-up tele consultation will be scheduled in the coming weeks.  Ronny Bacon, MD Pager #: [phone]

## 2020-04-07 ENCOUNTER — Other Ambulatory Visit: Payer: Self-pay | Admitting: Internal Medicine

## 2020-04-07 ENCOUNTER — Other Ambulatory Visit: Payer: Self-pay | Admitting: Interventional Radiology

## 2020-04-07 ENCOUNTER — Ambulatory Visit
Admission: RE | Admit: 2020-04-07 | Discharge: 2020-04-07 | Disposition: A | Discharge status: Home / Self Care | Payer: Self-pay | Source: Ambulatory Visit | Attending: Interventional Radiology | Admitting: Interventional Radiology

## 2020-04-07 DIAGNOSIS — N2889 Other specified disorders of kidney and ureter: Secondary | ICD-10-CM

## 2020-04-25 ENCOUNTER — Encounter: Payer: Self-pay | Admitting: *Deleted

## 2020-04-25 ENCOUNTER — Ambulatory Visit
Admission: RE | Admit: 2020-04-25 | Discharge: 2020-04-25 | Disposition: A | Discharge status: Home / Self Care | Payer: Medicaid Other | Source: Ambulatory Visit | Attending: Interventional Radiology | Admitting: Interventional Radiology

## 2020-04-25 ENCOUNTER — Other Ambulatory Visit: Payer: Self-pay

## 2020-04-25 HISTORY — PX: IR RADIOLOGIST EVAL & MGMT: IMG5224

## 2020-04-25 NOTE — Progress Notes (Signed)
Patient ID: Jeremy Conley, male   DOB: 1970-06-17, 49 y.o.   MRN: [id number]         Chief Complaint: Left sided renal lesion   Referring Physician(s): Erlene Quan (Urology)  History of Present Illness:  Jeremy Conley is a 49 y.o. male with past medical history significant for hypertension, hyperlipidemia and morbid obesity who was found to have an indeterminate left-sided renal lesion on outside abdominal CT performed for the work-up of a palpable knot within his mid abdomen (which was ultimately attributable to gastritis).  Patient was initially seen in the interventional radiology clinic on 11/19/2017 the patient haselected to pursue conservative management despite my offering of cryoablation given continued slow growth of the left-sided renal lesion.  Patient is now and seen in consultation via telemedicine after undergoing surveillance renal protocol CT scan performed 03/13/2020.  Patient states that he got COVID-19 in late September and is still suffering ill effects including decreased taste and smell, headaches and worsening of his baseline shortness of breath.  The patient is again without complaints regarding his incidentally discovered left-sided renal lesion. Specifically, no flank pain or hematuria.   Past Medical History:  Diagnosis Date  . Arthritis   . Arthritis   . Depression   . GERD (gastroesophageal reflux disease)   . Hyperlipidemia   . Hypertension   . Kidney mass     Past Surgical History:  Procedure Laterality Date  . IR RADIOLOGIST EVAL & MGMT  11/19/2017  . IR RADIOLOGIST EVAL & MGMT  03/24/2018  . IR RADIOLOGIST EVAL & MGMT  11/12/2018  . IR RADIOLOGIST EVAL & MGMT  09/01/2019  . KNEE ARTHROSCOPY Left 1993 and 1997    Allergies: Patient has no known allergies.  Medications: Prior to Admission medications   Medication Sig Start Date End Date Taking? Authorizing Provider  aspirin EC 81 MG tablet Take 81 mg by mouth daily.    [provider]  atorvastatin (LIPITOR) 20 MG tablet Take 20 mg by mouth. 01/16/17 08/05/19  [provider]  atorvastatin (LIPITOR) 20 MG tablet Take 20 mg by mouth daily. 02/16/18 03/03/19  [provider]  chlorhexidine (HIBICLENS) 4 % external liquid Use as a scalp and body wash. 02/05/19   [provider]  clindamycin (CLEOCIN T) 1 % external solution Apply topically to scalp and face daily. 01/14/17   [provider]  hydrochlorothiazide (HYDRODIURIL) 25 MG tablet Take 25 mg by mouth daily. 04/20/18   [provider]  HYDROcodone-acetaminophen (NORCO) 7.5-325 MG tablet Take 1-2 tablets by mouth daily. For chronic pain syndrome. Max 45/month 02/09/20 03/10/20  Gillis Santa, MD  HYDROcodone-acetaminophen (NORCO) 7.5-325 MG tablet Take 1-2 tablets by mouth daily. For chronic pain syndrome. Max 45/month 03/10/20 04/09/20  Gillis Santa, MD  HYDROcodone-acetaminophen (NORCO) 7.5-325 MG tablet Take 1-2 tablets by mouth daily. For chronic pain syndrome. Max 45/month 04/09/20 05/09/20  Gillis Santa, MD  losartan (COZAAR) 100 MG tablet Take 100 mg by mouth daily. 04/20/18   [provider]  meloxicam (MOBIC) 15 MG tablet Take 15 mg by mouth daily. 08/12/18   [provider]  meloxicam (MOBIC) 7.5 MG tablet Take 7.5 mg by mouth. 03/12/17   [provider]  metoprolol succinate (TOPROL-XL) 25 MG 24 hr tablet Take by mouth. 01/14/19 01/14/20  [provider]  omeprazole (PRILOSEC) 20 MG capsule Take 20 mg by mouth daily. 09/06/18   [provider]  Omeprazole 20 MG TBEC Take by mouth. 08/06/17 05/10/19  [provider]  tadalafil (CIALIS) 5 MG tablet TAKE ONE TABLET BY MOUTH DAILY AS NEEDED FOR ERECTILE DYSFUNCTION 04/08/19   [name], MD  triamcinolone cream (KENALOG) 0.1 % Apply to rash on arm 1-2 times daily as needed. 01/07/17   [provider]  VITAMIN D, CHOLECALCIFEROL, PO Take 5,000 Units by mouth  daily.    [provider]  zinc gluconate 50 MG tablet Take by mouth.    [provider]     Family History  Problem Relation Age of Onset  . Heart disease Father   . Bladder Cancer Neg Hx   . Kidney cancer Neg Hx   . Prostate cancer Neg Hx     Social History   Socioeconomic History  . Marital status: Single    Spouse name: Not on file  . Number of children: Not on file  . Years of education: Not on file  . Highest education level: Not on file  Occupational History  . Not on file  Tobacco Use  . Smoking status: Light Tobacco Smoker    Years: 15.00    Types: Cigars  . Smokeless tobacco: Never Used  Vaping Use  . Vaping Use: Never used  Substance and Sexual Activity  . Alcohol use: Yes    Comment: occassional  . Drug use: No  . Sexual activity: Yes    Birth control/protection: None  Other Topics Concern  . Not on file  Social History Narrative  . Not on file   Social Determinants of Health   Financial Resource Strain:   . Difficulty of Paying Living Expenses: Not on file  Food Insecurity:   . Worried About Charity fundraiser in the Last Year: Not on file  . Ran Out of Food in the Last Year: Not on file  Transportation Needs:   . Lack of Transportation (Medical): Not on file  . Lack of Transportation (Non-Medical): Not on file  Physical Activity:   . Days of Exercise per Week: Not on file  . Minutes of Exercise per Session: Not on file  Stress:   . Feeling of Stress : Not on file  Social Connections:   . Frequency of Communication with Friends and Family: Not on file  . Frequency of Social Gatherings with Friends and Family: Not on file  . Attends Religious Services: Not on file  . Active Member of Clubs or Organizations: Not on file  . Attends Archivist Meetings: Not on file  . Marital Status: Not on file    ECOG Status: 0 - Asymptomatic  Review of Systems  Review of Systems: A 12 point ROS discussed and pertinent  positives are indicated in the HPI above.  All other systems are negative.  Physical Exam No direct physical exam was performed (except for noted visual exam findings with Video Visits).   Vital Signs: There were no vitals taken for this visit.  Imaging:  Selected images from the following examination were reviewed in detail abdominal MRI - 08/21/2017; 03/24/2018; 11/09/2018; renal protocol CT scan - 08/09/2019; 03/13/2020  Left-sided renal lesion measurements as follows:  08/21/2017 - 1.1 x 1.1 cm. 04/03/2018 - 1.6 x 1.5 cm. 11/09/2018 - 1.7 x 1.6 cm. 08/09/2019 - 2.0 x 1.8 x 1.9 cm 03/13/2020 - 2.0 x 2.0 x 2.4 cm  Labs:  CBC: No results for input(s): WBC, HGB, HCT, PLT in the last 8760 hours.  COAGS: No results for input(s): INR, APTT in the last 8760 hours.  BMP: No results for input(s): NA, K, CL, CO2, GLUCOSE, BUN, CALCIUM, CREATININE, GFRNONAA, GFRAA in the last 8760 hours.  Invalid input(s): CMP  LIVER FUNCTION TESTS: No results for input(s): BILITOT, AST, ALT, ALKPHOS, PROT, ALBUMIN in the last 8760 hours.  TUMOR MARKERS: No results for input(s): AFPTM, CEA, CA199, CHROMGRNA in the last 8760 hours.  Assessment and Plan:  Jeremy Conley a 49y.o.malewith past medical history significant for hypertension, hyperlipidemia and morbid obesity whohas been followed for anincidentally discovered asymptomatic left-sided renal lesion.  I explained to the patient that as the left-sided renal lesion demonstrates imaging characteristics compatible with a renal cell carcinoma,and as such,biopsy is not warranted.  Left-sided renal lesion measurements as follows (by my direct remeasurement):  08/21/2017 - 1.1 x 1.1 cm. 04/03/2018 - 1.6 x 1.5 cm. 11/09/2018 - 1.7 x 1.6 cm. 08/09/2019 - 2.0 x 1.8 x 1.9 cm 03/13/2020 - 2.0 x 2.0 x 2.4 cm   Given continued growth of the left-sided renal lesion, I once again feel that dedicated intervention is warranted at this time.  I  explained that given his medical co-morbidities, he has been deemed a poor operative candidate by referring urologist, Dr. Erlene Quan, and as such, prolonged conversations were held with the patient regarding the benefits and risks of left-sided renal cryoablation including but not limited to bleeding, infection, worsening renal function and incomplete ablation.    I explained that as the lesion gets closer to 3 cm, the likelihood of incomplete ablation rises.  Additionally, I explained that imaging would likely be difficult due to his body habitus and therefore a smaller lesion would likely be more amenable to a complete and successful ablation.   Despite my encouragement, the patient is again hesitant to undergo this procedure stating that his sister recently passed away after undergoing an unspecified cancer related surgery.  Additionally, the patient states that he is still recovering from his COVID-19 infection and does not want to undergo intervention at this time.  I once again reiterated that as the lesion approximates 3 cm in diameter the chances of complete ablation are lowered and I do not feel it is necessary to keep monitoring the left-sided renal lesion if the patient is ultimately not going to want to have a procedure performed.  The patient assures me that if he will be willing to undergo the cryoablation following the next 6 month surveillance scan as this will allow him time to fully recover from his COVID-19 infection.  While I am somewhat skeptical, I have agreed to this plan of care however I told the patient that if he does not pursue cryoablation following the next examination I will no longer be involved with active surveillance of this lesion as he will likely no longer likely be a candidate for percutaneous cryoablation.  I have also encouraged the patient to receive the COVID-19 vaccination once eligible.  Patient knows to call the interventional radiology clinic with any  interval questions or concerns.  Plan:  - RTC following acquisition of surveillance renal protocol CT scan in 6 months (mid April). - Patient's current weight is unchanged at 345 with goal weight of 280.    Thank you for this interesting consult.  I greatly enjoyed meeting Jeremy Conley and look forward to participating in their care.  A copy of this report was sent to the requesting provider on this date.  Electronically Signed: Sandi Mariscal 04/25/2020, 8:24 AM   I spent a total of 15 Minutes in remote  clinical consultation, greater than 50% of which was counseling/coordinating care for left sided renal lesion.    Visit type: Audio only (telephone). Audio (no video) only due to patient's lack of internet/smartphone capability. Alternative for in-person consultation at [facility], [address] [place], [place], [place]. This visit type was conducted due to national recommendations for restrictions regarding the COVID-19 Pandemic (e.g. social distancing).  This format is felt to be most appropriate for this patient at this time.  All issues noted in this document were discussed and addressed.

## 2020-05-09 ENCOUNTER — Ambulatory Visit
Payer: Medicaid Other | Attending: Student in an Organized Health Care Education/Training Program | Admitting: Student in an Organized Health Care Education/Training Program

## 2020-05-09 ENCOUNTER — Other Ambulatory Visit: Payer: Self-pay

## 2020-05-09 ENCOUNTER — Telehealth: Payer: Self-pay | Admitting: Student in an Organized Health Care Education/Training Program

## 2020-05-09 ENCOUNTER — Encounter: Payer: Self-pay | Admitting: Student in an Organized Health Care Education/Training Program

## 2020-05-09 VITALS — BP 162/82 | HR 80 | Temp 98.4°F | Ht 72.0 in | Wt 340.0 lb

## 2020-05-09 DIAGNOSIS — M1711 Unilateral primary osteoarthritis, right knee: Secondary | ICD-10-CM | POA: Insufficient documentation

## 2020-05-09 DIAGNOSIS — M25562 Pain in left knee: Secondary | ICD-10-CM | POA: Insufficient documentation

## 2020-05-09 DIAGNOSIS — M1712 Unilateral primary osteoarthritis, left knee: Secondary | ICD-10-CM

## 2020-05-09 DIAGNOSIS — M25561 Pain in right knee: Secondary | ICD-10-CM

## 2020-05-09 DIAGNOSIS — G894 Chronic pain syndrome: Secondary | ICD-10-CM | POA: Insufficient documentation

## 2020-05-09 DIAGNOSIS — G8929 Other chronic pain: Secondary | ICD-10-CM

## 2020-05-09 DIAGNOSIS — I10 Essential (primary) hypertension: Secondary | ICD-10-CM | POA: Diagnosis present

## 2020-05-09 MED ORDER — HYDROCODONE-ACETAMINOPHEN 7.5-325 MG PO TABS
1.0000 | ORAL_TABLET | Freq: Every day | ORAL | 0 refills | Status: AC
Start: 2020-07-08 — End: 2020-08-07

## 2020-05-09 MED ORDER — HYDROCODONE-ACETAMINOPHEN 7.5-325 MG PO TABS
1.0000 | ORAL_TABLET | Freq: Every day | ORAL | 0 refills | Status: AC
Start: 2020-06-08 — End: 2020-07-08

## 2020-05-09 MED ORDER — HYDROCODONE-ACETAMINOPHEN 7.5-325 MG PO TABS
1.0000 | ORAL_TABLET | Freq: Every day | ORAL | 0 refills | Status: AC
Start: 2020-05-09 — End: 2020-06-08

## 2020-05-09 NOTE — Telephone Encounter (Signed)
The pharmacy called and need clarification on prescription

## 2020-05-09 NOTE — Telephone Encounter (Signed)
Patient states his pharmacy told him to have our office call to clarify his script sent in today

## 2020-05-09 NOTE — Progress Notes (Signed)
Nursing Pain Medication Assessment:  Safety precautions to be maintained throughout the outpatient stay will include: orient to surroundings, keep bed in low position, maintain call bell within reach at all times, provide assistance with transfer out of bed and ambulation.  Medication Inspection Compliance: Jeremy Conley did not comply with our request to bring his pills to be counted. He was reminded that bringing the medication bottles, even when empty, is a requirement.  Medication: None brought in. Pill/Patch Count: None available to be counted. Bottle Appearance: No container available. Did not bring bottle(s) to appointment. Filled Date: N/A Last Medication intake:  Yesterday

## 2020-05-09 NOTE — Telephone Encounter (Signed)
Patient called stating that [facility] would not give him his medications until we clarified some information.  Attempted to call [facility] and no one would answer the phone.  Let it ring 62 times.

## 2020-05-09 NOTE — Progress Notes (Signed)
PROVIDER NOTE: Information contained herein reflects review and annotations entered in association with encounter. Interpretation of such information and data should be left to medically-trained personnel. Information provided to patient can be located elsewhere in the medical record under "Patient Instructions". Document created using STT-dictation technology, any transcriptional errors that may result from process are unintentional.    Patient: Jeremy Conley  Service Category: E/M  Provider: Bilal Lateef, MD  DOB: 09/03/1970  DOS: 05/09/2020  Specialty: Interventional Pain Management  MRN: [id number]  Setting: Ambulatory outpatient  PCP: [facility]  Type: Established Patient    Referring Provider: [facility]  Location: Office  Delivery: Face-to-face     HPI  Mr. Jeremy Conley, a 49 y.o. year old male, is here today because of his Chronic pain syndrome [G89.4]. Jeremy Conley's primary complain today is Knee Pain (bilateral) Last encounter: My last encounter with him was on 12/16/2019. Pertinent problems: Jeremy Conley has Chronic pain syndrome; Morbid obesity (HCC); Bilateral primary osteoarthritis of knee; H/O arthroscopic knee surgery; Chronic pain of both knees; Primary osteoarthritis of right knee; and Primary osteoarthritis of left knee on their pertinent problem list. Pain Assessment: Severity of Chronic pain is reported as a 7 /10. Location: Knee Right,Left/denies. Onset: More than a month ago. Quality:  (crunching electricity). Timing: Constant. Modifying factor(s): medicine. Vitals:  height is 6' (1.829 m) and weight is 340 lb (154.2 kg) (abnormal). His temperature is 98.4 F (36.9 C). His blood pressure is 162/82 (abnormal) and his pulse is 80. His oxygen saturation is 96%.   Reason for encounter: medication management.   Patient had a dermatologic procedure in which they excised a portion of the scalp concerning for hidradenitis suppurativa versus dissecting cellulitis.   This was also done at the right mandible and in the occipital region.  He was referred to plastic surgery for more extensive surgery. Patient also had COVID-19 in September and states that he is continuing to experience muscle pains and aches since then. He is also endorsing worsening bilateral knee pain.  Pharmacotherapy Assessment   04/09/2020  02/08/2020   1  Hydrocodone-Acetamin 7.5-325  45.00  30  Bi Lat  [id number]  [place] (4231)  0/0  11.25 MME  Medicaid  [place]       Monitoring: [place] PMP: PDMP reviewed during this encounter.       Pharmacotherapy: No side-effects or adverse reactions reported. Compliance: No problems identified. Effectiveness: Clinically acceptable.  Tice, Kori M, RN  05/09/2020 11:11 AM  Signed Nursing Pain Medication Assessment:  Safety precautions to be maintained throughout the outpatient stay will include: orient to surroundings, keep bed in low position, maintain call bell within reach at all times, provide assistance with transfer out of bed and ambulation.  Medication Inspection Compliance: Jeremy Conley did not comply with our request to bring his pills to be counted. He was reminded that bringing the medication bottles, even when empty, is a requirement.  Medication: None brought in. Pill/Patch Count: None available to be counted. Bottle Appearance: No container available. Did not bring bottle(s) to appointment. Filled Date: N/A Last Medication intake:  Yesterday    UDS:  Summary  Date Value Ref Range Status  12/10/2016 FINAL  Final    Comment:    ==================================================================== TOXAS[facility]RE COMP DRUG ANALYSIS,UR ==================================================================== Test                             Result         Flag       Units Drug Absent but Declared for Prescription Verification   Hydrocodone                    Not Detected UNEXPECTED ng/mg creat   Gabapentin                     Not Detected UNEXPECTED    Acetaminophen                  Not Detected UNEXPECTED    Acetaminophen, as indicated in the declared medication list, is    not always detected even when used as directed. ==================================================================== Test                      Result    Flag   Units      Ref Range   Creatinine              343              mg/dL      >=20 ==================================================================== Declared Medications:  The flagging and interpretation on this report are based on the  following declared medications.  Unexpected results may arise from  inaccuracies in the declared medications.  **Note: The testing scope of this panel includes these medications:  Gabapentin (Neurontin)  Hydrocodone (Norco)  Hydrocodone (Vicodin)  **Note: The testing scope of this panel does not include small to  moderate amounts of these reported medications:  Acetaminophen (Norco)  Acetaminophen (Vicodin)  **Note: The testing scope of this panel does not include following  reported medications:  Hydrochlorothiazide (Hydrodiuril)  Losartan (Cozaar)  Meloxicam (Mobic) ==================================================================== For clinical consultation, please call [phone]. ====================================================================      ROS  Constitutional: Denies any fever or chills Gastrointestinal: No reported hemesis, hematochezia, vomiting, or acute GI distress Musculoskeletal: Denies any acute onset joint swelling, redness, loss of ROM, or weakness Neurological: No reported episodes of acute onset apraxia, aphasia, dysarthria, agnosia, amnesia, paralysis, loss of coordination, or loss of consciousness  Medication Review  Cholecalciferol, HYDROcodone-acetaminophen, Omeprazole, albuterol, aspirin EC, atorvastatin, azelastine, chlorhexidine, clindamycin, fluticasone, hydrochlorothiazide, loratadine, losartan, meloxicam, metoprolol  succinate, omeprazole, tadalafil, triamcinolone, and zinc gluconate  History Review  Allergy: Jeremy Conley has No Known Allergies. Drug: Jeremy Conley  reports no history of drug use. Alcohol:  reports current alcohol use. Tobacco:  reports that he has been smoking cigars. He has smoked for the past 15.00 years. He has never used smokeless tobacco. Social: Jeremy Conley  reports that he has been smoking cigars. He has smoked for the past 15.00 years. He has never used smokeless tobacco. He reports current alcohol use. He reports that he does not use drugs. Medical:  has a past medical history of Arthritis, Arthritis, Cancer (HCC), Depression, GERD (gastroesophageal reflux disease), Hyperlipidemia, Hypertension, and Kidney mass. Surgical: Jeremy Conley  has a past surgical history that includes Knee arthroscopy (Left, 1993 and 1997); IR Radiologist Eval & Mgmt (11/19/2017); IR Radiologist Eval & Mgmt (03/24/2018); IR Radiologist Eval & Mgmt (11/12/2018); IR Radiologist Eval & Mgmt (09/01/2019); and IR Radiologist Eval & Mgmt (04/25/2020). Family: family history includes Heart disease in his father.  Laboratory Chemistry Profile   Renal Lab Results  Component Value Date   BUN 17 04/08/2017   CREATININE 1.52 (H) 11/09/2018   GFRAA >60 11/09/2018   GFRNONAA 53 (L) 11/09/2018     Hepatic No results found for: AST, ALT, ALBUMIN, ALKPHOS, HCVAB,   AMYLASE, LIPASE, AMMONIA   Electrolytes Lab Results  Component Value Date   NA 130 (L) 04/08/2017   K 3.5 04/08/2017   CL 96 (L) 04/08/2017   CALCIUM 9.2 04/08/2017     Bone No results found for: VD25OH, [id number], [id number], HB7169CV8, 25OHVITD1, 25OHVITD2, 25OHVITD3, TESTOFREE, TESTOSTERONE   Inflammation (CRP: Acute Phase) (ESR: Chronic Phase) No results found for: CRP, ESRSEDRATE, LATICACIDVEN     Note: Above Lab results reviewed.   Physical Exam  General appearance: Well nourished, well developed, and well hydrated. In no apparent acute  distress Mental status: Alert, oriented x 3 (person, place, & time)       Respiratory: No evidence of acute respiratory distress Eyes: PERLA Vitals: BP (!) 162/82 (BP Location: Right Arm, Patient Position: Sitting, Cuff Size: Large)   Pulse 80   Temp 98.4 F (36.9 C)   Ht 6' (1.829 m)   Wt (!) 340 lb (154.2 kg)   SpO2 96%   BMI 46.11 kg/m  BMI: Estimated body mass index is 46.11 kg/m as calculated from the following:   Height as of this encounter: 6' (1.829 m).   Weight as of this encounter: 340 lb (154.2 kg). Ideal: Ideal body weight: 77.6 kg (171 lb 1.2 oz) Adjusted ideal body weight: 108.2 kg (238 lb 10.3 oz)    Gait & Posture Assessment  Ambulation:Patient ambulates using a cane Gait:Limited. Using assistive device to ambulate Posture:Difficulty standing up straight, due to pain  Lower Extremity Exam    Side:Right lower extremity  Side:Left lower extremity  Stability:No instability observed  Stability:No instability observed  Skin & Extremity Inspection:Skin color, temperature, and hair growth are WNL. No peripheral edema or cyanosis. No masses, redness, swelling, asymmetry, or associated skin lesions. No contractures.  Skin & Extremity Inspection:Skin color, temperature, and hair growth are WNL. No peripheral edema or cyanosis. No masses, redness, swelling, asymmetry, or associated skin lesions. No contractures.  Functional LFY:BOFB restricted ROMfor knee joint   Functional PZW:CHEN restricted ROMfor knee joint   Muscle Tone/Strength:Functionally intact. No obvious neuro-muscular anomalies detected.  Muscle Tone/Strength:Functionally intact. No obvious neuro-muscular anomalies detected.  Sensory (Neurological):Arthropathic arthralgia  Sensory (Neurological):Arthropathic arthralgia  DTR: Patellar:1+: trace Achilles:deferred today Plantar:deferred today  DTR: Patellar:1+: trace Achilles:deferred  today Plantar:deferred today  Palpation:No palpable anomalies  Palpation:No palpable anomalies     Assessment   Status Diagnosis  Worsening Worsening Worsening 1. Chronic pain syndrome   2. Primary osteoarthritis of right knee   3. Primary osteoarthritis of left knee   4. Morbid obesity ([place])   5. Chronic pain of both knees   6. Essential hypertension      Updated Problems: Problem  Primary Osteoarthritis of Right Knee  Primary Osteoarthritis of Left Knee  Chronic Pain Syndrome  Morbid Obesity (Hcc)   Overview:  per last BMI in vitals on 03/12/2017   H/O Arthroscopic Knee Surgery   93, 97- Left ACL   Chronic Pain of Both Knees  Bilateral Primary Osteoarthritis of Knee    Plan of Care  Jeremy Conley has a current medication list which includes the following long-term medication(s): albuterol, atorvastatin, atorvastatin, azelastine, fluticasone, hydrocodone-acetaminophen, [START ON 06/08/2020] hydrocodone-acetaminophen, [START ON 07/08/2020] hydrocodone-acetaminophen, loratadine, metoprolol succinate, omeprazole, omeprazole, proair hfa, and tadalafil.  Pharmacotherapy (Medications Ordered): Meds ordered this encounter  Medications  . HYDROcodone-acetaminophen (NORCO) 7.5-325 MG tablet    Sig: Take 1-2 tablets by mouth daily. For chronic pain syndrome. Max 45/month    Dispense:  60 tablet  Refill:  0  . HYDROcodone-acetaminophen (NORCO) 7.5-325 MG tablet    Sig: Take 1-2 tablets by mouth daily. For chronic pain syndrome. Max 45/month    Dispense:  60 tablet    Refill:  0  . HYDROcodone-acetaminophen (NORCO) 7.5-325 MG tablet    Sig: Take 1-2 tablets by mouth daily. For chronic pain syndrome. Max 45/month    Dispense:  60 tablet    Refill:  0   Orders:  Orders Placed This Encounter  Procedures  . Drug Screen 10 W/Conf, Serum    Order Specific Question:   Release to patient    Answer:   Immediate   Follow-up plan:   Return in about 3 months (around  08/07/2020) for Medication Management, in person.   Recent Visits No visits were found meeting these conditions. Showing recent visits within past 90 days and meeting all other requirements Today's Visits Date Type Provider Dept  05/09/20 Office Visit Gillis Santa, MD [facility]  Showing today's visits and meeting all other requirements Future Appointments Date Type Provider Dept  08/03/20 Appointment Gillis Santa, MD [facility]  Showing future appointments within next 90 days and meeting all other requirements  I discussed the assessment and treatment plan with the patient. The patient was provided an opportunity to ask questions and all were answered. The patient agreed with the plan and demonstrated an understanding of the instructions.  Patient advised to call back or seek an in-person evaluation if the symptoms or condition worsens.  Duration of encounter: 11mnutes.  Note by: BGillis Santa MD Date: 05/09/2020; Time: 12:34 PM

## 2020-05-22 LAB — THC,MS,WB/SP RFX
Cannabidiol: NEGATIVE ng/mL
Cannabinoid Confirmation: POSITIVE
Cannabinol: NEGATIVE ng/mL
Carboxy-THC: 34 ng/mL
Tetrahydrocannabinol(THC): 1.9 ng/mL

## 2020-05-22 LAB — OPIATES,MS,WB/SP RFX
6-Acetylmorphine: NEGATIVE
Codeine: NEGATIVE ng/mL
Dihydrocodeine: 1.4 ng/mL
Hydrocodone: 21.8 ng/mL
Hydromorphone: NEGATIVE ng/mL
Morphine: NEGATIVE ng/mL
Opiate Confirmation: POSITIVE

## 2020-05-22 LAB — OXYCODONES,MS,WB/SP RFX
Oxycocone: NEGATIVE ng/mL
Oxycodones Confirmation: NEGATIVE
Oxymorphone: NEGATIVE ng/mL

## 2020-05-22 LAB — DRUG SCREEN 10 W/CONF, SERUM
Amphetamines, IA: NEGATIVE ng/mL
Barbiturates, IA: NEGATIVE ug/mL
Benzodiazepines, IA: NEGATIVE ng/mL
Cocaine & Metabolite, IA: NEGATIVE ng/mL
Methadone, IA: NEGATIVE ng/mL
Opiates, IA: POSITIVE ng/mL — AB
Oxycodones, IA: NEGATIVE ng/mL
Phencyclidine, IA: NEGATIVE ng/mL
Propoxyphene, IA: NEGATIVE ng/mL
THC(Marijuana) Metabolite, IA: POSITIVE ng/mL — AB

## 2020-07-10 ENCOUNTER — Telehealth: Payer: Self-pay

## 2020-07-10 ENCOUNTER — Telehealth: Payer: Self-pay | Admitting: Student in an Organized Health Care Education/Training Program

## 2020-07-10 NOTE — Telephone Encounter (Signed)
Pt is requiring auth for Rx

## 2020-07-10 NOTE — Telephone Encounter (Signed)
done

## 2020-07-10 NOTE — Telephone Encounter (Signed)
Patient needs prior auth for his meds [facility]  [phone]  [place]

## 2020-08-01 ENCOUNTER — Telehealth: Payer: Medicaid Other | Admitting: Student in an Organized Health Care Education/Training Program

## 2020-08-03 ENCOUNTER — Encounter: Payer: Medicaid Other | Admitting: Student in an Organized Health Care Education/Training Program

## 2020-08-07 ENCOUNTER — Encounter: Payer: Medicaid Other | Admitting: Student in an Organized Health Care Education/Training Program

## 2020-08-25 ENCOUNTER — Other Ambulatory Visit: Payer: Self-pay

## 2020-08-25 ENCOUNTER — Other Ambulatory Visit: Payer: Self-pay | Admitting: Interventional Radiology

## 2020-08-25 DIAGNOSIS — N2889 Other specified disorders of kidney and ureter: Secondary | ICD-10-CM

## 2020-11-01 LAB — COLOGUARD: COLOGUARD: NEGATIVE

## 2020-11-22 ENCOUNTER — Other Ambulatory Visit: Payer: Self-pay

## 2020-11-22 ENCOUNTER — Ambulatory Visit
Admission: RE | Admit: 2020-11-22 | Discharge: 2020-11-22 | Disposition: A | Discharge status: Home / Self Care | Payer: Medicaid Other | Source: Ambulatory Visit | Attending: Interventional Radiology | Admitting: Interventional Radiology

## 2020-11-22 DIAGNOSIS — N2889 Other specified disorders of kidney and ureter: Secondary | ICD-10-CM

## 2020-11-22 HISTORY — PX: IR RADIOLOGIST EVAL & MGMT: IMG5224

## 2020-11-22 NOTE — Progress Notes (Signed)
Patient ID: Jeremy Conley, male   DOB: 1971-02-17, 50 y.o.   MRN: [id number]        Chief Complaint: Left sided renal lesion    Referring Physician(s): Erlene Quan (Urology)   History of Present Illness:   Jeremy Conley is a 50 y.o. male with past medical history significant for hypertension, hyperlipidemia and morbid obesity who was found to have an indeterminate left-sided renal lesion on outside abdominal CT performed for the work-up of a palpable knot within his mid abdomen (which was ultimately attributable to gastritis).   Patient was initially seen in the interventional radiology clinic on 11/19/2017 the patient has elected to pursue conservative management despite my offering of cryoablation given continued slow growth of the left-sided renal lesion.   Patient is now and seen in consultation via telemedicine after undergoing surveillance renal protocol CT scan performed 10/22/2020.   The patient is again without complaints regarding his incidentally discovered left-sided renal lesion.  Specifically, no flank pain or hematuria.    Past Medical History:  Diagnosis Date   Arthritis    Arthritis    Cancer ([place])    Depression    GERD (gastroesophageal reflux disease)    Hyperlipidemia    Hypertension    Kidney mass     Past Surgical History:  Procedure Laterality Date   IR RADIOLOGIST EVAL & MGMT  11/19/2017   IR RADIOLOGIST EVAL & MGMT  03/24/2018   IR RADIOLOGIST EVAL & MGMT  11/12/2018   IR RADIOLOGIST EVAL & MGMT  09/01/2019   IR RADIOLOGIST EVAL & MGMT  04/25/2020   KNEE ARTHROSCOPY Left 1993 and 1997    Allergies: Patient has no known allergies.  Medications: Prior to Admission medications   Medication Sig Start Date End Date Taking? Authorizing Provider  albuterol (VENTOLIN HFA) 108 (90 Base) MCG/ACT inhaler Inhale into the lungs. 11/15/19   [provider]  aspirin EC 81 MG tablet Take 81 mg by mouth daily.    [provider]  atorvastatin  (LIPITOR) 20 MG tablet Take 20 mg by mouth. 01/16/17 05/09/20  [provider]  atorvastatin (LIPITOR) 20 MG tablet Take 20 mg by mouth daily. 02/16/18 03/03/19  [provider]  azelastine (ASTELIN) 0.1 % nasal spray 1 spray into each nostril Two (2) times a day. Use in each nostril as directed 02/03/20 02/02/21  [provider]  chlorhexidine (HIBICLENS) 4 % external liquid Use as a scalp and body wash. 02/05/19   [provider]  clindamycin (CLEOCIN T) 1 % external solution Apply topically to scalp and face daily. 01/14/17   [provider]  fluticasone (FLONASE) 50 MCG/ACT nasal spray 1 spray into each nostril Two (2) times a day. 04/05/20 04/05/21  [provider]  hydrochlorothiazide (HYDRODIURIL) 25 MG tablet Take 25 mg by mouth daily. 04/20/18   [provider]  HYDROcodone-acetaminophen (NORCO) 7.5-325 MG tablet Take 1-2 tablets by mouth daily. For chronic pain syndrome. Max 45/month 05/09/20 06/08/20  Gillis Santa, MD  HYDROcodone-acetaminophen (NORCO) 7.5-325 MG tablet Take 1-2 tablets by mouth daily. For chronic pain syndrome. Max 45/month 06/08/20 07/08/20  Gillis Santa, MD  HYDROcodone-acetaminophen (NORCO) 7.5-325 MG tablet Take 1-2 tablets by mouth daily. For chronic pain syndrome. Max 45/month 07/08/20 08/07/20  Gillis Santa, MD  loratadine (CLARITIN) 10 MG tablet Take 10 mg by mouth daily. 11/15/19   [provider]  losartan (COZAAR) 100 MG tablet Take 100 mg by mouth daily. 04/20/18   [provider]  meloxicam (MOBIC) 15 MG tablet Take 15 mg by mouth daily. 08/12/18   [provider]  metoprolol succinate (TOPROL-XL) 25 MG 24 hr tablet Take by mouth. 01/14/19 05/09/20  [provider]  omeprazole (PRILOSEC) 20 MG capsule Take 20 mg by mouth daily. 09/06/18   [provider]  Omeprazole 20 MG TBEC Take by mouth. 08/06/17 05/10/19  [provider]  PROAIR HFA 108 (90 Base) MCG/ACT  inhaler SMARTSIG:2 Puff(s) By Mouth Every 6 Hours PRN 02/04/20   [provider]  tadalafil (CIALIS) 5 MG tablet TAKE ONE TABLET BY MOUTH DAILY AS NEEDED FOR ERECTILE DYSFUNCTION 04/08/19   [name], MD  triamcinolone cream (KENALOG) 0.1 % Apply to rash on arm 1-2 times daily as needed. 01/07/17   [provider]  VITAMIN D, CHOLECALCIFEROL, PO Take 5,000 Units by mouth daily.    [provider]  zinc gluconate 50 MG tablet Take by mouth.    [provider]     Family History  Problem Relation Age of Onset   Heart disease Father    Bladder Cancer Neg Hx    Kidney cancer Neg Hx    Prostate cancer Neg Hx     Social History   Socioeconomic History   Marital status: Single    Spouse name: Not on file   Number of children: Not on file   Years of education: Not on file   Highest education level: Not on file  Occupational History   Not on file  Tobacco Use   Smoking status: Light Smoker    Pack years: 0.00    Types: Cigars   Smokeless tobacco: Never  Vaping Use   Vaping Use: Never used  Substance and Sexual Activity   Alcohol use: Yes    Comment: occassional   Drug use: No   Sexual activity: Yes    Birth control/protection: None  Other Topics Concern   Not on file  Social History Narrative   Not on file   Social Determinants of Health   Financial Resource Strain: Not on file  Food Insecurity: Not on file  Transportation Needs: Not on file  Physical Activity: Not on file  Stress: Not on file  Social Connections: Not on file    ECOG Status: 1 - Symptomatic but completely ambulatory  Review of Systems  Review of Systems: A 12 point ROS discussed and pertinent positives are indicated in the HPI above.  All other systems are negative.  Physical Exam No direct physical exam was performed (except for noted visual exam findings with Video Visits).   Vital Signs: There were no vitals taken for this visit.  Imaging:     Selected images from the following examination were reviewed in detail abdominal MRI - 08/21/2017; 03/24/2018; 11/09/2018; renal protocol CT scan - 10/25/2020; 08/09/2019; 03/13/2020   Left-sided renal lesion measurements as follows:   08/21/2017 - 1.1 x 1.1 cm. 04/03/2018 - 1.6 x 1.5 cm. 11/09/2018 - 1.7 x 1.6 cm. 08/09/2019 - 2.0 x 1.8 x 1.9 cm 03/13/2020 - 2.0 x 2.0 x 2.4 cm 10/22/2020 - 3.0 x 2.1 x 2.0 cm  The left renal vein appears widely patent.  No evidence of regional metastatic disease.   Labs:  CBC: No results for input(s): WBC, HGB, HCT, PLT in the last 8760 hours.  COAGS: No results for input(s): INR, APTT in the last 8760 hours.  BMP: No results for input(s): NA, K, CL, CO2, GLUCOSE, BUN, CALCIUM, CREATININE, GFRNONAA, GFRAA in  the last 8760 hours.  Invalid input(s): CMP  LIVER FUNCTION TESTS: No results for input(s): BILITOT, AST, ALT, ALKPHOS, PROT, ALBUMIN in the last 8760 hours.  TUMOR MARKERS: No results for input(s): AFPTM, CEA, CA199, CHROMGRNA in the last 8760 hours.  Assessment and Plan:  PARAS KREIDER is a 50 y.o. male with past medical history significant for hypertension, hyperlipidemia and morbid obesity who has been followed for an incidentally discovered asymptomatic left-sided renal lesion.   I explained to the patient that as the left-sided renal lesion demonstrates imaging characteristics compatible with a renal cell carcinoma, and as such, biopsy is not warranted.     Selected images from the following examination were reviewed in detail abdominal MRI - 08/21/2017; 03/24/2018; 11/09/2018; renal protocol CT scan - 10/25/2020; 08/09/2019; 03/13/2020   Left-sided renal lesion measurements as follows:   08/21/2017 - 1.1 x 1.1 cm. 04/03/2018 - 1.6 x 1.5 cm. 11/09/2018 - 1.7 x 1.6 cm. 08/09/2019 - 2.0 x 1.8 x 1.9 cm 03/13/2020 - 2.0 x 2.0 x 2.4 cm 10/22/2020 - 3.0 x 2.1 x 2.0 cm  The left renal vein appears widely patent.  No evidence of regional metastatic  disease.   Given continued growth of the left-sided renal lesion, I once again feel that dedicated intervention is warranted at this time.   I explained that given his medical co-morbidities, he has been deemed a poor operative candidate by referring urologist, Dr. Erlene Quan, and as such, prolonged conversations were held with the patient regarding the benefits and risks of left-sided renal cryoablation including but not limited to bleeding, infection, worsening renal function and incomplete ablation.     I (once again) strongly encouraged cryoablation as the lesion now measures 3 cm, the likelihood of incomplete ablation has significantly risen since the time of our initial involvement in his care and once the lesion is greater than 3 cm he is likely NOT a candidate for attempted ablation.    Despite this prolonged conversation (that I've had on ALL of his previous encounters), the patient refuses to agree to undergo the procedure.     As such, I will no longer be involved with active surveillance of this lesion as he will likely no longer likely be a candidate for percutaneous cryoablation as the lesion is soon to be > 3 cm.    Additionally, given his hesitancy to undergo any procedures definitive surgical resection is likely in his best interest as it is highly unlikely he would agree to a repeat cryoablation in the case of recurrent/residual disease, now of higher likelihood given the size of the lesion in additional to the patient's extreme body habitus.  Plan: - Patient refuses cryoablation therefore I will re-refer the patient back to Dr. Erlene Quan for surveillance at her discretion.  - No additional follow-up with IR.      A copy of this report was sent to the requesting provider on this date.  Electronically Signed: Sandi Mariscal 11/22/2020, 12:40 PM   I spent a total of 15 Minutes in remote  clinical consultation, greater than 50% of which was counseling/coordinating care for left sided  renal lesion.    Visit type: Audio only (telephone). Audio (no video) only due to patient's lack of internet/smartphone capability. Alternative for in-person consultation at [facility], [address], [place], [place]. This visit type was conducted due to national recommendations for restrictions regarding the COVID-19 Pandemic (e.g. social distancing).  This format is felt to be most appropriate for this patient  at this time.  All issues noted in this document were discussed and addressed.

## 2020-11-23 ENCOUNTER — Encounter: Payer: Self-pay | Admitting: *Deleted

## 2020-11-23 ENCOUNTER — Telehealth: Payer: Self-pay

## 2020-11-23 DIAGNOSIS — N2889 Other specified disorders of kidney and ureter: Secondary | ICD-10-CM

## 2020-11-23 NOTE — Telephone Encounter (Signed)
Jeremy Conley, can you have him follow-up with me in 6 months with another CT abdomen with and without contrast (renal protocol)?  At minimum, we can keep an eye on this thing and refer him to medical oncology he if and when it metastasizes.   [name], MD    . Spoke with patient, scheduled appt. CT ordered. Patient was asking if the lesion has changed in size. Please advise.   As Per Dr. Erlene Quan according to IR is had tripled in size since 2019. And agrees with treatment. I spoke with pt he declines treatment and states " he read the [facility] from [facility] and is 6 months is has not changed"  patient will follow up in 6 months, does not want to be seen sooner.

## 2021-02-07 ENCOUNTER — Ambulatory Visit: Payer: Medicaid Other | Admitting: Podiatry

## 2021-02-21 ENCOUNTER — Ambulatory Visit: Payer: Self-pay | Admitting: Podiatry

## 2021-05-23 ENCOUNTER — Ambulatory Visit: Payer: Medicaid Other

## 2021-05-25 ENCOUNTER — Telehealth: Payer: Self-pay | Admitting: *Deleted

## 2021-05-25 NOTE — Telephone Encounter (Signed)
Called patient regarding follow up with Dr. Erlene Quan. Patient did not get CT done, he states he missed the appointment. He gets his imaging done at [facility]. Patient will get scan rescheduled and notify the office. Voiced understanding importance to follow up.

## 2021-05-30 ENCOUNTER — Ambulatory Visit: Payer: Self-pay | Admitting: Urology

## 2021-07-12 ENCOUNTER — Telehealth: Payer: Self-pay | Admitting: Urology

## 2021-07-12 NOTE — Telephone Encounter (Signed)
Left message for patient to call back. I need his insurance information, trying to get his CT scan approved. It says that his [place] MCD is no longer active.  Sharyn Lull

## 2021-07-17 ENCOUNTER — Telehealth: Payer: Self-pay | Admitting: Urology

## 2021-07-17 NOTE — Telephone Encounter (Signed)
Patient called and stated that he was suppose to have a referral to have a CT done at [facility] [place].  Phone number [phone]

## 2021-07-18 NOTE — Telephone Encounter (Signed)
Left VM for patient to return call

## 2021-08-03 NOTE — Telephone Encounter (Signed)
Patient canceled follow up-is under new care of urologist in [place].  ? ?
